# Patient Record
Sex: Female | Born: 1955 | Race: White | Hispanic: No | Marital: Married | Smoking: Never smoker
Health system: Southern US, Community
[De-identification: ages and names within clinical notes are randomized; demographics above are authoritative.]

## PROBLEM LIST (undated history)

## (undated) DIAGNOSIS — M1990 Unspecified osteoarthritis, unspecified site: Secondary | ICD-10-CM

## (undated) DIAGNOSIS — R42 Dizziness and giddiness: Secondary | ICD-10-CM

## (undated) DIAGNOSIS — R339 Retention of urine, unspecified: Secondary | ICD-10-CM

## (undated) DIAGNOSIS — E7800 Pure hypercholesterolemia, unspecified: Secondary | ICD-10-CM

## (undated) DIAGNOSIS — F0390 Unspecified dementia without behavioral disturbance: Secondary | ICD-10-CM

## (undated) DIAGNOSIS — G4730 Sleep apnea, unspecified: Secondary | ICD-10-CM

## (undated) HISTORY — DX: Sleep apnea, unspecified: G47.30

## (undated) HISTORY — PX: SALPINGECTOMY: SHX328

## (undated) HISTORY — DX: Dizziness and giddiness: R42

## (undated) HISTORY — DX: Unspecified osteoarthritis, unspecified site: M19.90

## (undated) HISTORY — DX: Pure hypercholesterolemia, unspecified: E78.00

---

## 1974-07-26 HISTORY — PX: TONSILECTOMY, ADENOIDECTOMY, BILATERAL MYRINGOTOMY AND TUBES: SHX2538

## 1975-07-27 HISTORY — PX: SPLENECTOMY, TOTAL: SHX788

## 2005-01-08 ENCOUNTER — Ambulatory Visit: Payer: Self-pay | Admitting: Internal Medicine

## 2005-01-23 ENCOUNTER — Ambulatory Visit: Payer: Self-pay | Admitting: Internal Medicine

## 2005-04-20 ENCOUNTER — Ambulatory Visit: Payer: Self-pay | Admitting: Internal Medicine

## 2005-07-26 HISTORY — PX: VAGINAL HYSTERECTOMY: SUR661

## 2006-04-03 ENCOUNTER — Emergency Department: Payer: Self-pay | Admitting: General Practice

## 2006-04-12 ENCOUNTER — Ambulatory Visit: Payer: Self-pay | Admitting: Physician Assistant

## 2006-07-22 ENCOUNTER — Emergency Department: Payer: Self-pay | Admitting: Emergency Medicine

## 2006-10-17 ENCOUNTER — Ambulatory Visit: Payer: Self-pay | Admitting: Internal Medicine

## 2006-10-29 ENCOUNTER — Encounter: Admission: RE | Admit: 2006-10-29 | Discharge: 2006-10-29 | Payer: Self-pay | Admitting: Orthopedic Surgery

## 2006-12-16 ENCOUNTER — Ambulatory Visit: Payer: Self-pay | Admitting: Otolaryngology

## 2007-10-19 ENCOUNTER — Ambulatory Visit: Payer: Self-pay | Admitting: Internal Medicine

## 2008-10-03 ENCOUNTER — Emergency Department: Payer: Self-pay | Admitting: Emergency Medicine

## 2008-10-21 ENCOUNTER — Ambulatory Visit: Payer: Self-pay | Admitting: Internal Medicine

## 2008-11-11 ENCOUNTER — Ambulatory Visit: Payer: Self-pay | Admitting: Internal Medicine

## 2008-12-09 ENCOUNTER — Ambulatory Visit: Payer: Self-pay | Admitting: Internal Medicine

## 2009-01-06 ENCOUNTER — Ambulatory Visit: Payer: Self-pay | Admitting: Internal Medicine

## 2009-01-09 ENCOUNTER — Ambulatory Visit: Payer: Self-pay | Admitting: Internal Medicine

## 2009-01-10 ENCOUNTER — Ambulatory Visit: Payer: Self-pay | Admitting: Unknown Physician Specialty

## 2009-03-28 ENCOUNTER — Emergency Department: Payer: Self-pay | Admitting: Emergency Medicine

## 2009-04-28 ENCOUNTER — Ambulatory Visit: Payer: Self-pay | Admitting: Internal Medicine

## 2009-05-13 ENCOUNTER — Ambulatory Visit: Payer: Self-pay | Admitting: Internal Medicine

## 2009-09-12 ENCOUNTER — Ambulatory Visit: Payer: Self-pay | Admitting: Internal Medicine

## 2009-09-16 ENCOUNTER — Ambulatory Visit: Payer: Self-pay | Admitting: Otolaryngology

## 2009-10-23 ENCOUNTER — Ambulatory Visit: Payer: Self-pay | Admitting: Internal Medicine

## 2009-12-05 ENCOUNTER — Ambulatory Visit: Payer: Self-pay | Admitting: Family Medicine

## 2010-04-07 ENCOUNTER — Ambulatory Visit: Payer: Self-pay | Admitting: Internal Medicine

## 2010-05-26 ENCOUNTER — Encounter: Payer: Self-pay | Admitting: Otolaryngology

## 2010-08-16 ENCOUNTER — Encounter: Payer: Self-pay | Admitting: Orthopedic Surgery

## 2010-08-21 IMAGING — CT CT HEAD WITHOUT CONTRAST
2 series · 16 of 30 positions shown, 20 images · non-contrast
Comparison: none

REASON FOR EXAM: generalized tingling and slow speech
COMMENTS:

[Series 2: without · axial · non-contrast · 0.40mm/px · z∈[+668,+793]mm · 13 of 60 slices shown, 17 images]
[im 5/60  brain]
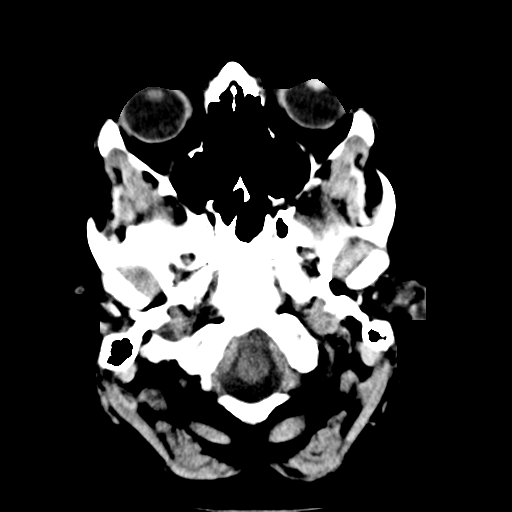
[im 5/60  bone]
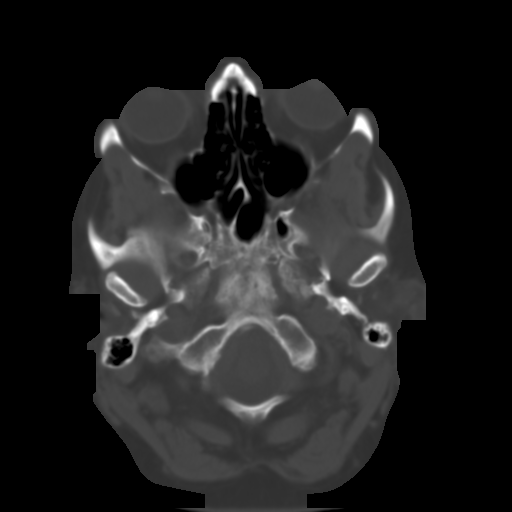
[im 9/60  brain]
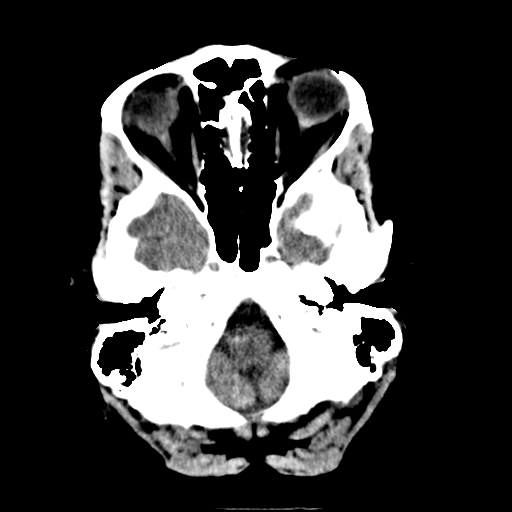
[im 13/60  brain]
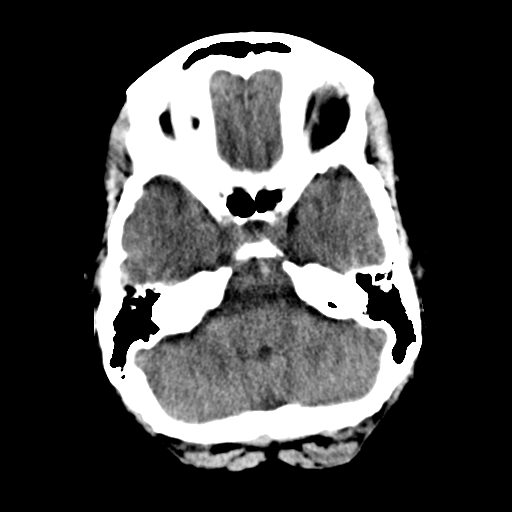
[im 17/60  brain]
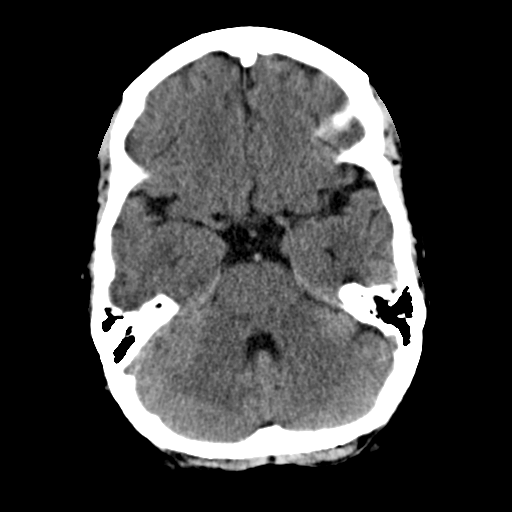
[im 22/60  brain]
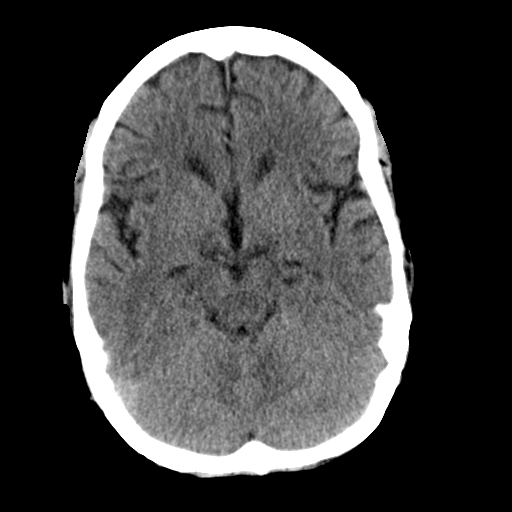
[im 22/60  bone]
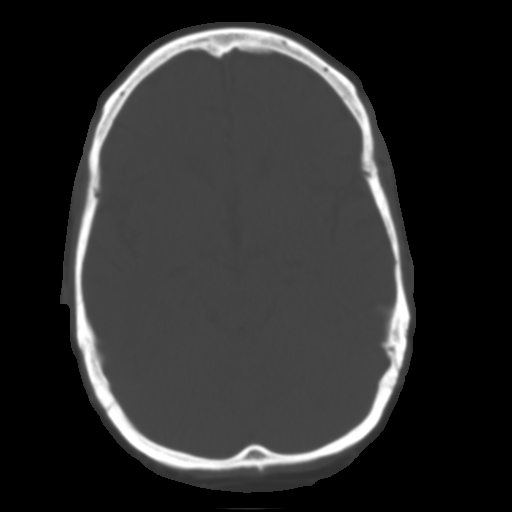
[im 26/60  brain]
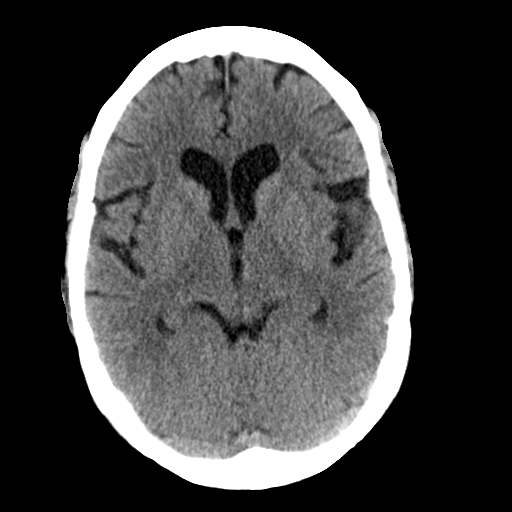
[im 30/60  brain]
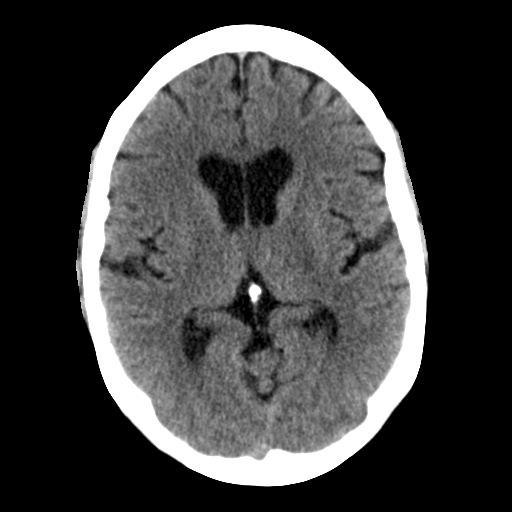
[im 34/60  brain]
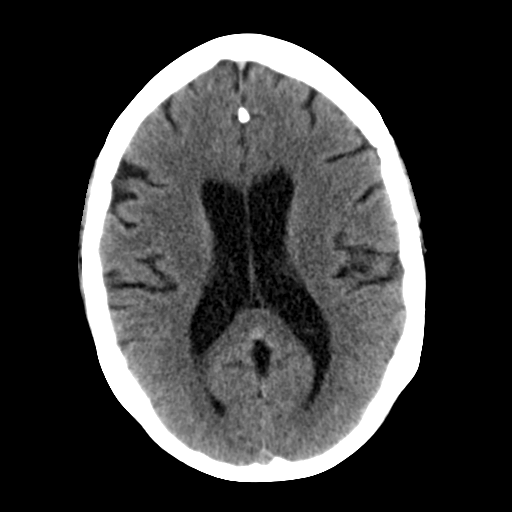
[im 38/60  brain]
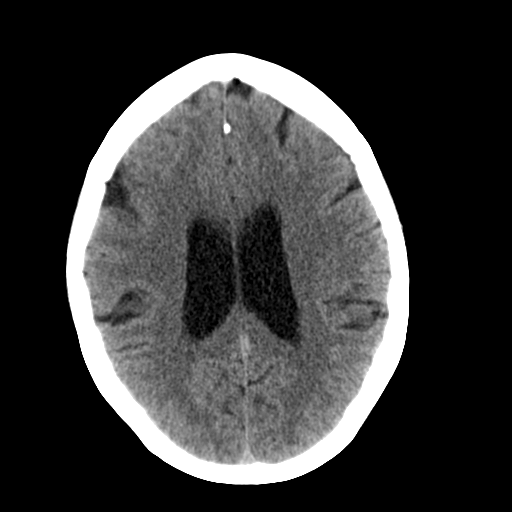
[im 38/60  bone]
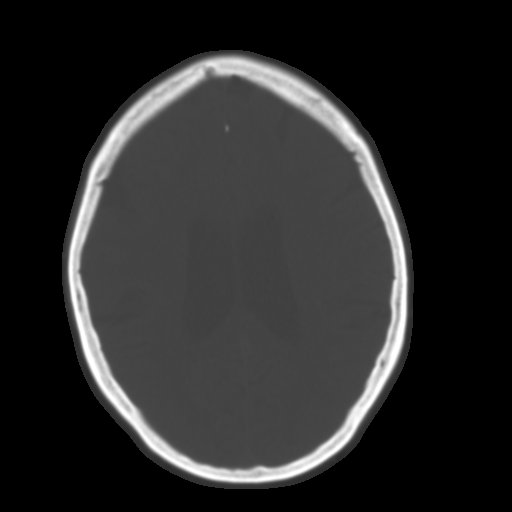
[im 43/60  brain]
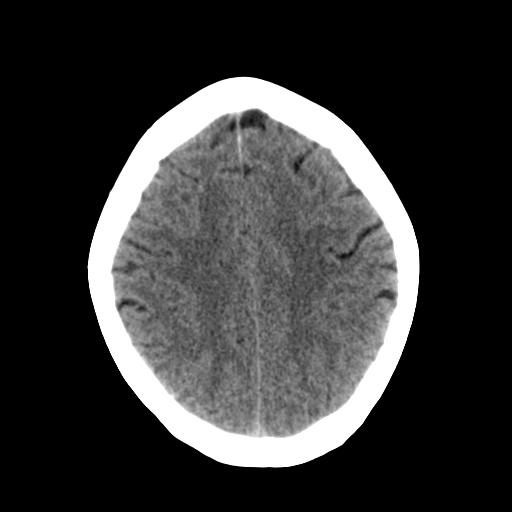
[im 47/60  brain]
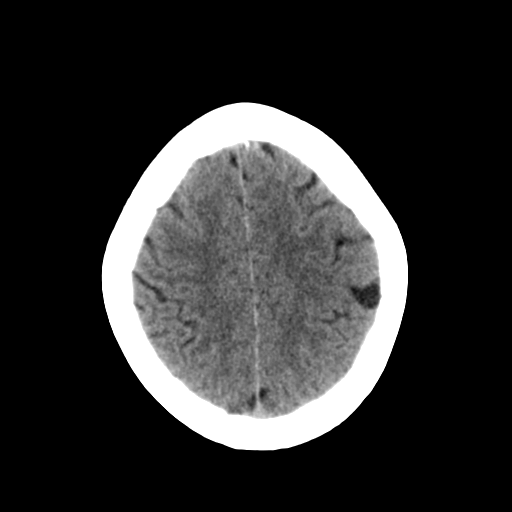
[im 51/60  brain]
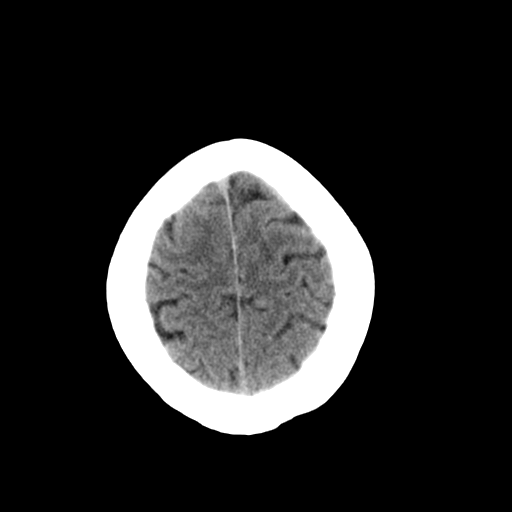
[im 55/60  brain]
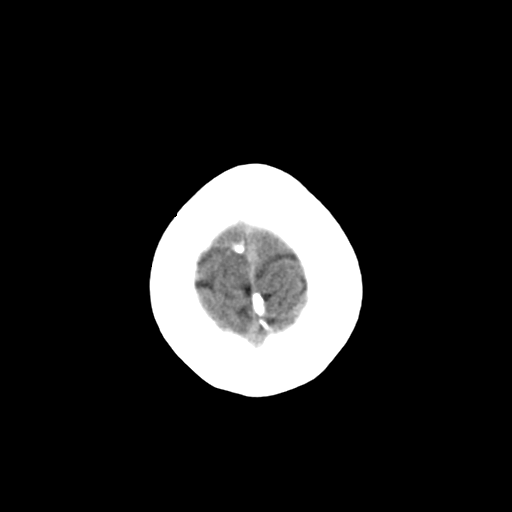
[im 55/60  bone]
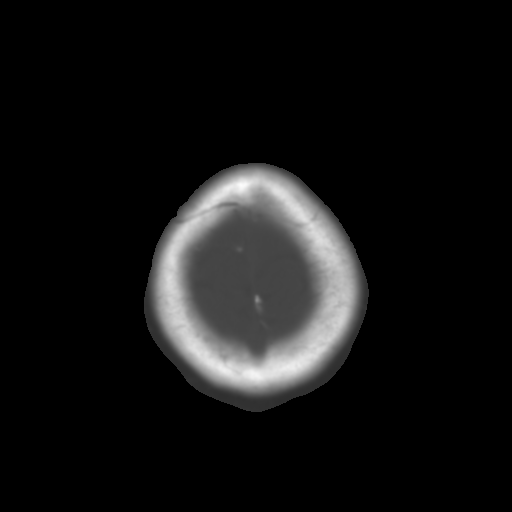

[Series 3: bone · axial · 0.40mm/px · z∈[+668,+708]mm · 3 of 60 slices shown]
[im 5/60  bone]
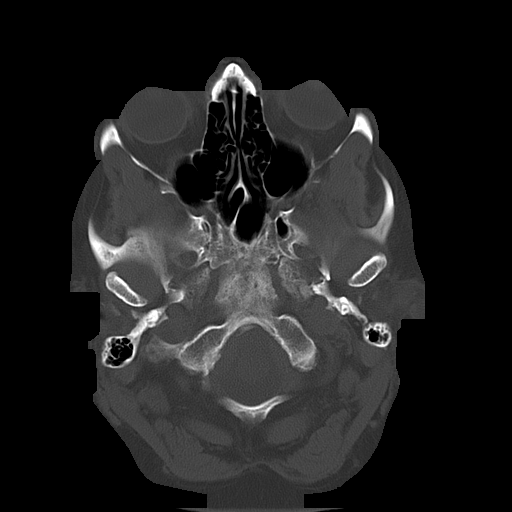
[im 13/60  bone]
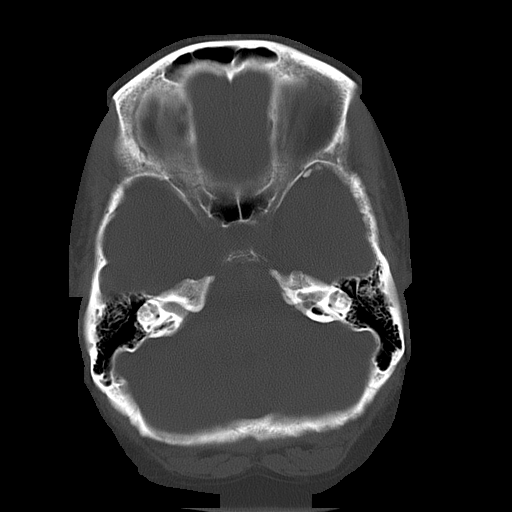
[im 22/60  bone]
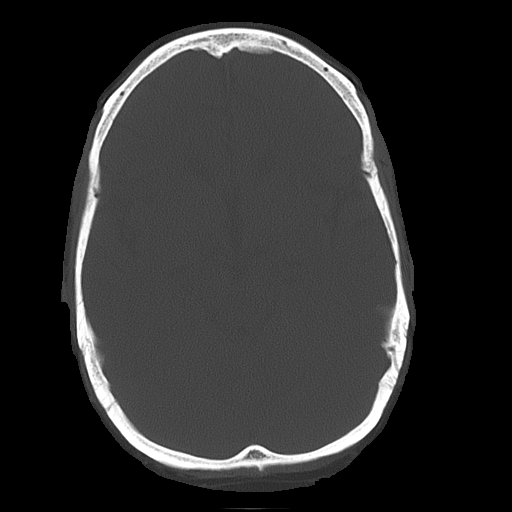

[16 of 30 positions shown; findings below may reference images not displayed]

PROCEDURE:     CT  - CT HEAD WITHOUT CONTRAST  - October 03, 2008  [DATE]

RESULT:     History: Abnormal speech

Comparison studies: No recent

Procedure and Findings: No intra-axial or extra-axial pathologic fluid or
blood collections identified. No mass lesions noted. No hydrocephalus. No
bony abnormalities noted.
IMPRESSION: No acute abnormality identified. This report was phoned to
patient's physician at the time of study.

## 2010-10-02 ENCOUNTER — Ambulatory Visit: Payer: Self-pay | Admitting: Internal Medicine

## 2010-10-14 ENCOUNTER — Emergency Department: Payer: Self-pay | Admitting: Emergency Medicine

## 2010-10-27 ENCOUNTER — Ambulatory Visit: Payer: Self-pay | Admitting: Internal Medicine

## 2010-11-09 ENCOUNTER — Other Ambulatory Visit: Payer: Self-pay | Admitting: Rheumatology

## 2010-11-19 ENCOUNTER — Other Ambulatory Visit: Payer: Self-pay | Admitting: Podiatry

## 2011-03-16 ENCOUNTER — Ambulatory Visit: Payer: Self-pay | Admitting: Internal Medicine

## 2011-04-30 ENCOUNTER — Emergency Department: Payer: Self-pay | Admitting: *Deleted

## 2011-05-13 ENCOUNTER — Ambulatory Visit: Payer: Self-pay | Admitting: Internal Medicine

## 2011-08-02 ENCOUNTER — Ambulatory Visit: Payer: Self-pay | Admitting: Rheumatology

## 2011-08-03 ENCOUNTER — Encounter: Payer: Self-pay | Admitting: Rheumatology

## 2011-08-27 ENCOUNTER — Encounter: Payer: Self-pay | Admitting: Rheumatology

## 2011-09-24 ENCOUNTER — Encounter: Payer: Self-pay | Admitting: Rheumatology

## 2011-11-02 ENCOUNTER — Ambulatory Visit: Payer: Self-pay | Admitting: Internal Medicine

## 2011-12-27 ENCOUNTER — Emergency Department: Payer: Self-pay | Admitting: Emergency Medicine

## 2011-12-27 LAB — URINALYSIS, COMPLETE
Bilirubin,UR: NEGATIVE
Blood: NEGATIVE
Glucose,UR: NEGATIVE mg/dL (ref 0–75)
Ph: 9 (ref 4.5–8.0)
Protein: NEGATIVE
RBC,UR: 1 /HPF (ref 0–5)
Specific Gravity: 1.003 (ref 1.003–1.030)
Squamous Epithelial: 22

## 2011-12-27 LAB — COMPREHENSIVE METABOLIC PANEL
Albumin: 3.8 g/dL (ref 3.4–5.0)
Alkaline Phosphatase: 45 U/L — ABNORMAL LOW (ref 50–136)
Anion Gap: 13 (ref 7–16)
BUN: 7 mg/dL (ref 7–18)
Bilirubin,Total: 0.5 mg/dL (ref 0.2–1.0)
Co2: 24 mmol/L (ref 21–32)
EGFR (Non-African Amer.): >60
Osmolality: 283 (ref 275–301)
Potassium: 3.3 mmol/L — ABNORMAL LOW (ref 3.5–5.1)
SGOT(AST): 27 U/L (ref 15–37)
Sodium: 143 mmol/L (ref 136–145)
Total Protein: 7.4 g/dL (ref 6.4–8.2)

## 2011-12-27 LAB — CBC
HCT: 40.1 % (ref 35.0–47.0)
MCHC: 33.6 g/dL (ref 32.0–36.0)
Platelet: 409 10*3/uL (ref 150–440)
RBC: 4.44 10*6/uL (ref 3.80–5.20)
RDW: 13.8 % (ref 11.5–14.5)
WBC: 6.9 10*3/uL (ref 3.6–11.0)

## 2011-12-27 LAB — CK TOTAL AND CKMB (NOT AT ARMC): CK-MB: 3.5 ng/mL (ref 0.5–3.6)

## 2011-12-27 LAB — TROPONIN I: Troponin-I: <0.02 ng/mL

## 2011-12-30 ENCOUNTER — Ambulatory Visit: Payer: Self-pay | Admitting: Physician Assistant

## 2012-03-05 ENCOUNTER — Ambulatory Visit: Payer: Self-pay | Admitting: Emergency Medicine

## 2012-03-05 LAB — COMPREHENSIVE METABOLIC PANEL
Albumin: 3.9 g/dL (ref 3.4–5.0)
Alkaline Phosphatase: 73 U/L (ref 50–136)
Anion Gap: 7 (ref 7–16)
BUN: 10 mg/dL (ref 7–18)
Bilirubin,Total: 0.4 mg/dL (ref 0.2–1.0)
Calcium, Total: 9.5 mg/dL (ref 8.5–10.1)
Chloride: 102 mmol/L (ref 98–107)
Co2: 31 mmol/L (ref 21–32)
Creatinine: 0.68 mg/dL (ref 0.60–1.30)
EGFR (African American): >60
EGFR (Non-African Amer.): >60
Glucose: 94 mg/dL (ref 65–99)
Osmolality: 278 (ref 275–301)
Potassium: 4.2 mmol/L (ref 3.5–5.1)
SGOT(AST): 21 U/L (ref 15–37)
SGPT (ALT): 25 U/L (ref 12–78)
Sodium: 140 mmol/L (ref 136–145)
Total Protein: 7.9 g/dL (ref 6.4–8.2)

## 2012-03-05 LAB — CBC WITH DIFFERENTIAL/PLATELET
Basophil #: 0.1 10*3/uL (ref 0.0–0.1)
Basophil %: 0.7 %
Eosinophil #: 0.1 10*3/uL (ref 0.0–0.7)
Eosinophil %: 0.7 %
HCT: 42.1 % (ref 35.0–47.0)
HGB: 14.1 g/dL (ref 12.0–16.0)
Lymphocyte #: 2.8 10*3/uL (ref 1.0–3.6)
Lymphocyte %: 32.2 %
MCH: 31 pg (ref 26.0–34.0)
MCHC: 33.6 g/dL (ref 32.0–36.0)
MCV: 92 fL (ref 80–100)
Monocyte #: 0.6 x10 3/mm (ref 0.2–0.9)
Monocyte %: 6.9 %
Neutrophil #: 5.2 10*3/uL (ref 1.4–6.5)
Neutrophil %: 59.5 %
Platelet: 415 10*3/uL (ref 150–440)
RBC: 4.56 10*6/uL (ref 3.80–5.20)
RDW: 13.7 % (ref 11.5–14.5)
WBC: 8.7 10*3/uL (ref 3.6–11.0)

## 2012-03-05 LAB — URINALYSIS, COMPLETE
Bilirubin,UR: NEGATIVE
Blood: NEGATIVE
Glucose,UR: NEGATIVE mg/dL (ref 0–75)
Ketone: NEGATIVE
Nitrite: NEGATIVE
Ph: 6.5 (ref 4.5–8.0)

## 2012-03-07 LAB — URINE CULTURE

## 2012-04-26 ENCOUNTER — Encounter: Payer: Self-pay | Admitting: Internal Medicine

## 2012-04-26 ENCOUNTER — Ambulatory Visit (INDEPENDENT_AMBULATORY_CARE_PROVIDER_SITE_OTHER): Admitting: Internal Medicine

## 2012-04-26 VITALS — BP 126/80 | HR 77 | Temp 98.7°F | Ht 69.0 in | Wt 187.5 lb

## 2012-04-26 DIAGNOSIS — R42 Dizziness and giddiness: Secondary | ICD-10-CM | POA: Insufficient documentation

## 2012-04-26 DIAGNOSIS — M129 Arthropathy, unspecified: Secondary | ICD-10-CM

## 2012-04-26 DIAGNOSIS — M1990 Unspecified osteoarthritis, unspecified site: Secondary | ICD-10-CM

## 2012-04-26 MED ORDER — FLUOXETINE HCL 20 MG PO TABS: 10.0000 mg | ORAL_TABLET | Freq: Every day | ORAL | Status: DC

## 2012-04-26 NOTE — Progress Notes (Signed)
Subjective:    Patient ID: Melinda [name], female    DOB: April 12, 1956, 56 y.o.   MRN: [id number]  HPI 56 year old female with past history of arthritis and splenectomy who comes in today for a scheduled follow up.  She reports being under an increased amount of stress recently.  Her husband has mesothelioma/asbestosis and is in failing health.  She is having to do the work around the house and is the sole caregiver for him.  No significant depression.  No suicidal ideations.  She is feeling overwhelmed.  She had lots of questions regarding what services are available for help within her home.  She also reports having increased problems with "vertigo".  This is an ongoing issue for her. She has had an extensive w/up for the dizziness.  Is seeing Dr Elenore Rota (ENT) and Dr Sherryll Burger.  She is on chronic Meclizine and Valium.  No headache.  She has noticed some increased belching.  No acid reflux.  No change in her bowels.    Past Medical History  Diagnosis Date  . Arthritis   . Vertigo     Review of Systems Patient denies any headache.  Does have the dizziness as outlined above.  Has had MRI and extensive w/up previously.   No chest pain, tightness or palpitations.  No increased shortness of breath, cough or congestion.  No nausea or vomiting.  No abdominal pain or cramping.  No bowel change, such as diarrhea, constipation, BRBPR or melana.  No urine change.  She does report some increased belching, but no acid reflux.  She is eating and drinking well.  Increased stress as outlined above.       Objective:   Physical Exam Filed Vitals:   04/26/12 1116  BP: 126/80  Pulse: 77  Temp: 98.7 F (12.68 C)   56 year old female in no acute distress.   HEENT:  Nares - clear.  OP- without lesions or erythema.  NECK:  Supple, nontender.  No audible carotid bruit.   HEART:  Appears to be regular. LUNGS:  Without crackles or wheezing audible.  Respirations even and unlabored.   RADIAL PULSE:  Equal  bilaterally.  ABDOMEN:  Soft, nontender.  No audible abdominal bruit.   EXTREMITIES:  No increased edema to be present.                      Assessment & Plan:  1.  INCREASED PSYCHOSOCIAL STRESSORS.  Discussed at length with her today.  Spent more than 25 minutes with the patient and more than 50% of the time was spent in discussing her current situation, stress and some recommendations to help her home situation.  We also discussed the possibility of referral to a counselor as well as starting on medication (ie Prozac, Zoloft, etc).  She wants to hold on counseling or medication at this point.  Will follow.  She was instructed that if symptoms worsened or if she changed her mind, she was to notify me.    Addendum:  After pt left, she called back and expressed a desire to start medication.  We discussed Prozac.  Discussed possible side effects and drug interactions.  Will start Prozac 10mg  q day.  Monitor.  Call if problems.    2.  GYN.  Continues on Premarin.  Discussed with her today.  She desires to remain on this medication.    3.  GI.  Some increased belching.  Monitor dietary intake to see if  certain foods aggravate.  Has tried Librarian, academic.  Obtain records from previous GI w/up.  Can try a trial of Ranitidine.    3.  HEALTH MAINTENANCE.  Will obtain her records for review.  Will schedule her physical and mammogram when due.  She has already received her flu shot.

## 2012-04-26 NOTE — Patient Instructions (Addendum)
It was good to see you today.  I am sorry to hear about the issues with your husband.  Let us know if you need something more.  I would contact the [facility] regarding possible options for assistance in your home.  I will obtain records from [place] to review.

## 2012-04-26 NOTE — Assessment & Plan Note (Signed)
She has been worked up extensively in the past.  She sees Dr Elenore Rota (ENT) and Dr Sherryll Burger (Neurology).  Dr Elenore Rota prescribes her Valium.  We discussed referral back to neurology - given this exacerbation of her symptoms.  She expressed that she may want to get another opinion in the future.  Wants to hold on any further testing or w/up at this time.  Will notify me if she changes her mind.

## 2012-04-26 NOTE — Assessment & Plan Note (Addendum)
Relatively stable. 

## 2012-04-27 ENCOUNTER — Telehealth: Payer: Self-pay | Admitting: Internal Medicine

## 2012-04-27 MED ORDER — ESTROGENS CONJUGATED 0.45 MG PO TABS: ORAL_TABLET | ORAL | Status: DC

## 2012-04-27 NOTE — Telephone Encounter (Signed)
Pt called she went to her drug store She got her prozac She was checking on her premarin Pharmacy :  [facility]  [phone]

## 2012-04-27 NOTE — Telephone Encounter (Signed)
i sent the rx to [facility] for her premarin

## 2012-04-28 ENCOUNTER — Telehealth: Payer: Self-pay | Admitting: Internal Medicine

## 2012-04-28 NOTE — Telephone Encounter (Signed)
Error

## 2012-04-29 ENCOUNTER — Ambulatory Visit: Payer: Self-pay | Admitting: Medical

## 2012-05-01 ENCOUNTER — Telehealth: Payer: Self-pay | Admitting: Internal Medicine

## 2012-05-01 DIAGNOSIS — F419 Anxiety disorder, unspecified: Secondary | ICD-10-CM

## 2012-05-01 NOTE — Telephone Encounter (Signed)
Pt is saying that since she has been on Prozac she has gotten extremely worse and has been to an Urgent care and ER over the weekend. She isn't sure what she should do.

## 2012-05-01 NOTE — Addendum Note (Signed)
Addended by: Charm Barges on: 05/01/2012 03:25 PM   Modules accepted: Orders

## 2012-05-01 NOTE — Telephone Encounter (Signed)
In what way is she worse?  rec stopping the Prozac.  If increased problems with stress rec referral to psych (or at least a counselor).

## 2012-05-01 NOTE — Telephone Encounter (Signed)
I don't know of anyone in [place], but can refer to Melinda [name] (at [facility] - psychologist who works closely with a psychiatrist).  Also Melinda [name] [facility]), or Melinda [name] (here in town).  These are the ones I am aware of.  I will refer her to either one - or if she knows someone in [place] or closer to her - I can refer.  Need ER records to review if possible.  If she has any acute problems, she needs to return to the ER.

## 2012-05-01 NOTE — Telephone Encounter (Signed)
Patient stated that she is having increased agitation, disoriented, and she has been very cold.  She went to Urgent Care in [facility] on Friday.  Patient was also seen in [facility] ED on Sunday, she would like to see a counselor or  Psychiatrist but would like someone in [place] if possible.

## 2012-05-01 NOTE — Telephone Encounter (Signed)
Patient advised as instructed via telephone, she is willing to see Dr. Laymond Purser at [facility].  She will wait to hear from [facility] regarding appt.

## 2012-05-03 ENCOUNTER — Telehealth: Payer: Self-pay | Admitting: Internal Medicine

## 2012-05-03 DIAGNOSIS — F419 Anxiety disorder, unspecified: Secondary | ICD-10-CM

## 2012-05-03 NOTE — Telephone Encounter (Signed)
Pt wanted to know if she could have her pneumococcal Shot  Pt wanted check on her referral to counciler

## 2012-05-03 NOTE — Telephone Encounter (Signed)
I put in the order for her psych referral.  Erie Noe will let her know when appt scheduled.  Regarding her pneumovax - need to know when she received her last.  See if she can find out.  Thanks.

## 2012-05-03 NOTE — Telephone Encounter (Signed)
Patient advised as instructed via telephone, she stated that she signed a release of info form and sent it in.  Advised patient that we will need to get her records to see when her last pneumonia vaccine was.

## 2012-05-03 NOTE — Telephone Encounter (Signed)
Order placed for referral to psychology

## 2012-05-12 ENCOUNTER — Telehealth: Payer: Self-pay | Admitting: Internal Medicine

## 2012-05-12 NOTE — Telephone Encounter (Signed)
I can see her at 8:00 wed 05/17/12.  If any acute sx, she needs eval over the weekend.  Thanks.

## 2012-05-12 NOTE — Telephone Encounter (Signed)
Pt informed of MD's advisement. Appointment schedule 05/17/2012 at 8:00am, pt informed of new appointment.

## 2012-05-12 NOTE — Telephone Encounter (Signed)
Patient has an appointment scheduled for 05/19/12 @ 2:00 pm - Is there any way she can get an appointment earlier in the week ?  Caller: Melinda [name]/Patient; PCP: [facility]; CB#: [phone]; Hysterectomy. 05/12/12 - Patient needs a dosage for potassium. Patient states that she had total weakness and her arms & legs went numb for about 5 minutes. She is still jittery and "not herself" today, but has had no numbness today. Muscle soreness/tiredness today - able to walk short distances. Afebrile. All Emergent Signs/Symptoms of Weakness or Paralysis Protocol ruled out except "all other situations" (disposition-see in 72 hours). Advised not to take any potassium supplements unless it is recommended by her physician   Advised patient to be seen and evaluated. Patient stated that she has no way to get anywhere at this time and does have an appointment scheduled for 05/19/12 that was made after she called the office yesterday afternoon about her symptoms. Advised to call 911 if she has another episode. Note sent to office to see if an appointment could be scheduled any sooner than 05/19/12.

## 2012-05-14 ENCOUNTER — Emergency Department: Payer: Self-pay | Admitting: Emergency Medicine

## 2012-05-14 LAB — URINALYSIS, COMPLETE
Bacteria: NONE SEEN
Bilirubin,UR: NEGATIVE
Glucose,UR: NEGATIVE mg/dL (ref 0–75)
Leukocyte Esterase: NEGATIVE
RBC,UR: NONE SEEN /HPF (ref 0–5)
Squamous Epithelial: 1
WBC UR: 1 /HPF (ref 0–5)

## 2012-05-14 LAB — CBC
HGB: 14.3 g/dL (ref 12.0–16.0)
MCH: 30.5 pg (ref 26.0–34.0)
MCHC: 33.7 g/dL (ref 32.0–36.0)
RBC: 4.69 10*6/uL (ref 3.80–5.20)

## 2012-05-14 LAB — COMPREHENSIVE METABOLIC PANEL
Albumin: 4.1 g/dL (ref 3.4–5.0)
Anion Gap: 9 (ref 7–16)
BUN: 10 mg/dL (ref 7–18)
Bilirubin,Total: 0.5 mg/dL (ref 0.2–1.0)
Calcium, Total: 9.5 mg/dL (ref 8.5–10.1)
Chloride: 104 mmol/L (ref 98–107)
Creatinine: 0.66 mg/dL (ref 0.60–1.30)
EGFR (African American): >60
EGFR (Non-African Amer.): >60
Potassium: 3.3 mmol/L — ABNORMAL LOW (ref 3.5–5.1)
SGOT(AST): 26 U/L (ref 15–37)

## 2012-05-15 ENCOUNTER — Telehealth: Payer: Self-pay | Admitting: Internal Medicine

## 2012-05-15 NOTE — Telephone Encounter (Signed)
Pt called to let you know she was in er @[facility]  10/20.  i faxed to get notes.  Pt stated they found nothing wrong told her it was stress related and weakness Pt wanted to cancel appointment appointment for 10/23.  Advise pt to wait until Tuesday before lunch (12) to cancel.

## 2012-05-16 NOTE — Telephone Encounter (Signed)
Left message at home for pt to call.  Wanted to see if pt was still coming in on 10/23

## 2012-05-16 NOTE — Telephone Encounter (Signed)
Is she going to cancel appt.  I am ok with canceling if she does not feels she needs to come in.  Thanks.

## 2012-05-17 ENCOUNTER — Ambulatory Visit (INDEPENDENT_AMBULATORY_CARE_PROVIDER_SITE_OTHER): Admitting: Internal Medicine

## 2012-05-17 ENCOUNTER — Encounter: Payer: Self-pay | Admitting: Internal Medicine

## 2012-05-17 VITALS — BP 120/68 | HR 75 | Temp 98.1°F | Ht 69.0 in | Wt 182.0 lb

## 2012-05-17 DIAGNOSIS — M79609 Pain in unspecified limb: Secondary | ICD-10-CM

## 2012-05-17 DIAGNOSIS — R109 Unspecified abdominal pain: Secondary | ICD-10-CM

## 2012-05-17 DIAGNOSIS — R531 Weakness: Secondary | ICD-10-CM

## 2012-05-17 DIAGNOSIS — R5383 Other fatigue: Secondary | ICD-10-CM

## 2012-05-17 DIAGNOSIS — M79606 Pain in leg, unspecified: Secondary | ICD-10-CM

## 2012-05-17 DIAGNOSIS — E878 Other disorders of electrolyte and fluid balance, not elsewhere classified: Secondary | ICD-10-CM

## 2012-05-17 DIAGNOSIS — G4730 Sleep apnea, unspecified: Secondary | ICD-10-CM

## 2012-05-17 DIAGNOSIS — R42 Dizziness and giddiness: Secondary | ICD-10-CM

## 2012-05-17 LAB — POTASSIUM: Potassium: 4.2 meq/L (ref 3.5–5.1)

## 2012-05-17 LAB — CK: Total CK: 154 U/L (ref 7–177)

## 2012-05-17 NOTE — Telephone Encounter (Signed)
Pt came in for her appointment

## 2012-05-17 NOTE — Patient Instructions (Addendum)
It was nice seeing you today.  I am sorry you have not been feeling well.  I am going to refer you to gastroenterology and schedule a pelvic ultrasound.  I am also going to check labs today.  We will notify you of the results once they become available.

## 2012-05-18 ENCOUNTER — Encounter: Payer: Self-pay | Admitting: Internal Medicine

## 2012-05-18 ENCOUNTER — Telehealth: Payer: Self-pay | Admitting: Internal Medicine

## 2012-05-18 MED ORDER — ESTROGENS CONJUGATED 0.45 MG PO TABS: ORAL_TABLET | ORAL | Status: DC

## 2012-05-18 NOTE — Telephone Encounter (Signed)
Pt is calling and wondering about her MyChart. She is saying it says overdue beside everything ??? I don't know much about MyChart so she wanted to speak to a nurse to see if everything was put in right in her chart.

## 2012-05-18 NOTE — Telephone Encounter (Signed)
Pt left message  Checking  on her premairn rx

## 2012-05-18 NOTE — Telephone Encounter (Signed)
Spoke with patient Premarin script resent to [facility] in [place] and set it as her preferred pharmacy.

## 2012-05-18 NOTE — Telephone Encounter (Signed)
Spoke with patient discussed why her record had incomplete hx documentation (awaiting records from [place]) will enter the information that she sent via MyChart email.

## 2012-05-19 ENCOUNTER — Ambulatory Visit: Admitting: Internal Medicine

## 2012-05-22 ENCOUNTER — Encounter: Payer: Self-pay | Admitting: *Deleted

## 2012-05-22 ENCOUNTER — Ambulatory Visit: Payer: Self-pay | Admitting: Internal Medicine

## 2012-05-22 NOTE — Progress Notes (Signed)
Results letter mailed to patient.

## 2012-05-24 ENCOUNTER — Encounter: Payer: Self-pay | Admitting: Internal Medicine

## 2012-05-24 ENCOUNTER — Other Ambulatory Visit: Payer: Self-pay | Admitting: Internal Medicine

## 2012-05-24 DIAGNOSIS — G4730 Sleep apnea, unspecified: Secondary | ICD-10-CM | POA: Insufficient documentation

## 2012-05-24 DIAGNOSIS — R1900 Intra-abdominal and pelvic swelling, mass and lump, unspecified site: Secondary | ICD-10-CM

## 2012-05-24 NOTE — Assessment & Plan Note (Signed)
stalbe

## 2012-05-24 NOTE — Progress Notes (Signed)
Subjective:    Patient ID: Melinda [name], female    DOB: 04-26-1956, 56 y.o.   MRN: [id number]  HPI 56 year old female with past history of hypercholesterolemia and vertigo (s/p extensive w/up - followed by neurology) who comes in today for a follow up appt.  She was in the ER 05/14/12 for "weakness".  She also states at that time - her legs were aching.  Labs, EKG (per pt) unrevealing.  She did have a slightly decreased potassium - 3.3.  She saw Dr Gwen Pounds 05/16/12 for a follow up.  Had a stress test and states she was told "everything was ok".  Was placed on Prilosec.  She felt the Prilosec contributed to increased gas.  She also was questioning whether or not her CPAP could be causing some increased gas.  No vomiting.  Increased belching.  Some abdominal discomfort - fullness.  No vomiting.  No acute bowel change.    Past Medical History  Diagnosis Date  . Arthritis   . Vertigo   . Hypercholesterolemia   . Sleep apnea     Review of Systems Patient denies any headache.  Dizziness/light headedness - unchanged.  Has been worked up extensively.  Saw ENT and Neurology.   No palpatations.  Denies any significant chest pain.  No increased shortness of breath, cough or congestion.  No nausea or vomiting.  Does report the increased abdominal fullness, gas and belching.  No bowel change, such as diarrhea, constipation, BRBPR or melana.  No urine change.  No vaginal symptoms.      Objective:   Physical Exam Filed Vitals:   05/17/12 0809  BP: 120/68  Pulse: 75  Temp: 98.1 F (8.49 C)   56 year old female in no acute distress.   HEENT:  Nares - clear.  OP- without lesions or erythema.  NECK:  Supple, nontender.  No audible carotid bruit.   HEART:  Appears to be regular. LUNGS:  Without crackles or wheezing audible.  Respirations even and unlabored.   RADIAL PULSE:  Equal bilaterally.  ABDOMEN:  Soft.  Bowel sounds present and normal.  Minimal increased discomfort in the lower  abdomen/pelvic region.  No audible abdominal bruit.   EXTREMITIES:  No increased edema to be present.                     Assessment & Plan:  INCREASED ABDOMINAL BLOATING AND GAS.  Has tried Prilosec and other PPIs without improvement.  She feels the PPIs may have made symptoms worse.  Can try Zantac to see if she gets any improvement.  Has tried Librarian, academic - with no change in symptoms.  Given persistent symptoms, will refer back to GI for evaluation and further w/up.  Will also check a pelvic ultrasound to confirm ovaries normal - given the persistent fullness/bloating and increased gas.  She also has some minimal lower abdomen/pelvic discomfort on exam.  Further w/up pending results of above.  Pt comfortable with this plan.   HYPOKALEMIA.  Potassium slightly decreased in the ER.  Recheck today.    CARDIOVASCULAR.  Just saw Dr Gwen Pounds.  Per his note, the stress test revealed normal LV function at peak stress.  There were changes in the inferior intraseptal area.  These changes were not associated with symptoms or EKG changes.  He did not feel this was clinically significant and did not feel further cardiac w/up warranted.    INCREASED PSYCHOSOCIAL STRESSORS.  Keep scheduled appt with Evalina Field.  Appears to be stable.  Still dealing with the increased stress of her husbands medical issues.  Again discussed with her today regarding getting some assistance in the home.

## 2012-05-24 NOTE — Assessment & Plan Note (Signed)
Continues on CPAP 

## 2012-05-25 ENCOUNTER — Encounter: Payer: Self-pay | Admitting: Internal Medicine

## 2012-05-26 ENCOUNTER — Telehealth: Payer: Self-pay | Admitting: Internal Medicine

## 2012-05-26 NOTE — Telephone Encounter (Signed)
Pt was calling concerning A1c. She wanted to speak with a nurse about labwork.

## 2012-05-28 ENCOUNTER — Encounter: Payer: Self-pay | Admitting: Internal Medicine

## 2012-06-06 ENCOUNTER — Ambulatory Visit: Payer: Self-pay | Admitting: Obstetrics and Gynecology

## 2012-06-07 ENCOUNTER — Telehealth: Payer: Self-pay | Admitting: Internal Medicine

## 2012-06-07 NOTE — Telephone Encounter (Signed)
Since I am unable to call pt back right now, please call her and find out what is going on.

## 2012-06-07 NOTE — Telephone Encounter (Signed)
Dr Lindell Spar is going to send her to [facility] for follow-up at 3:00 12/10. Patient is not sure if this is the right appt. Info. She wants to know if you would know and call her back to let her know.Melinda [name]

## 2012-06-07 NOTE — Telephone Encounter (Signed)
Called pt and discussed.  Pt was concerned that she had an EGD scheduled on 07/04/12 - same day as appt with Dr Hyacinth Meeker.  She will call Dawn's offce (GI) and let them know needs to reschedule.  Please call [facility] and request copy of her CT scan (done yesterday).  Pt would like for me to have copy.

## 2012-06-07 NOTE — Telephone Encounter (Signed)
Pt is extremely scared about news she received from Dr. Lindell Spar about her recent CT scan. She wanted to speak with Dr. Lorin Picket.

## 2012-06-08 ENCOUNTER — Telehealth: Payer: Self-pay | Admitting: Internal Medicine

## 2012-06-08 NOTE — Telephone Encounter (Signed)
Pt left message to let you know she has consultation  With gi on 07/04/12 not an upper gi  She also confirmed her appointment with bridgett miller 07/04/12 @ 3pm

## 2012-06-08 NOTE — Telephone Encounter (Signed)
[facility], they are going to fax over ct scan.

## 2012-06-08 NOTE — Telephone Encounter (Signed)
Noted.  Hold message until we receive.

## 2012-06-09 ENCOUNTER — Telehealth: Payer: Self-pay | Admitting: Internal Medicine

## 2012-06-09 NOTE — Telephone Encounter (Signed)
Pt was notified previously about her pelvic ultrasound results.  Was referred to GYN (Dr Jean Rosenthal).  He evaluated.  CT ordered.  CT revealed cystic mass.  Referred to Dr Purvis Sheffield.  See note.

## 2012-06-09 NOTE — Telephone Encounter (Signed)
Discussed CT findings with pt.  Dr Jean Rosenthal ordered.  She has already been referred to Dr Purvis Sheffield for evaluation and further testing.  Discussed the pulmonary nodule.  Will wait on chest CT until after Dr Rondel Baton appt.  (to see what she orders).

## 2012-06-26 ENCOUNTER — Encounter: Payer: Self-pay | Admitting: Internal Medicine

## 2012-07-04 ENCOUNTER — Ambulatory Visit: Payer: Self-pay | Admitting: Gynecologic Oncology

## 2012-07-12 ENCOUNTER — Encounter: Payer: Self-pay | Admitting: Internal Medicine

## 2012-07-14 ENCOUNTER — Telehealth: Payer: Self-pay | Admitting: Internal Medicine

## 2012-07-14 NOTE — Telephone Encounter (Signed)
Pt is calling and wanted to let you know that Dr. Jean Rosenthal with [facility] set her up for surgery on 08/08/12. He is going to do the surgery but Dr. Hyacinth Meeker is going to assist with him. She just wanted to let you know.

## 2012-07-26 ENCOUNTER — Ambulatory Visit: Payer: Self-pay | Admitting: Gynecologic Oncology

## 2012-07-27 ENCOUNTER — Telehealth: Payer: Self-pay | Admitting: Internal Medicine

## 2012-07-27 NOTE — Telephone Encounter (Signed)
I spoke to pt and explained to her that the reason why her health maintenance was not updated was because she was worked in for acute problems and we did not address health maintenance issues.  I updated what I could.  I explained to her we would try to get this updated when she came for her physical.  Please call [facility] (michelle) and see if she can send her last few notes and the last pneumovax date and the last colonoscopy.  Also please notify Asher Muir or Zella Ball that she needs to cancer her physical on 08/02/12 - secondary to her having surgery. Thanks.

## 2012-07-27 NOTE — Telephone Encounter (Signed)
Pt called stated her my chart is telling the below items are past due  Flu shot   Pt received 04/19/12 at [facility] in [place]  Pap smear  Pt stated she does not get these any more  tentus  Pt wasn't for when she received this  Mammogram  Pt had done 10/06/11 @ [facility] [place]  colonscopy pt stated has been 2 or 3 years ago.  Pt stated was good for 10 years.  pniumia shot  Pt stated she didn't have a spleen so she needs to get this shot  Is this ok to make appointment for this??  Pt stated she has appointment Dr Almon Hercules obgyn  pt is having preop on  Jan 7 @ 8:10  surgery is jan 14 to remove a 4 in object in pelvis  Does pt need to come in for her cpx 08/02/12?   Pt would like you to update this in her chart

## 2012-07-28 NOTE — Telephone Encounter (Signed)
Called Marcelino Duster at [facility] to have patient's info sent over. Left detailed message on her voice mail.

## 2012-07-28 NOTE — Telephone Encounter (Signed)
Melinda [name] would you please cancel patients appointment for physical on 08/02/12- secondary to her having surgery. Thank You

## 2012-08-01 ENCOUNTER — Ambulatory Visit: Payer: Self-pay | Admitting: Obstetrics and Gynecology

## 2012-08-01 LAB — COMPREHENSIVE METABOLIC PANEL
Alkaline Phosphatase: 58 U/L (ref 50–136)
BUN: 9 mg/dL (ref 7–18)
Bilirubin,Total: 0.4 mg/dL (ref 0.2–1.0)
Calcium, Total: 9.2 mg/dL (ref 8.5–10.1)
Chloride: 102 mmol/L (ref 98–107)
EGFR (African American): >60
Glucose: 105 mg/dL — ABNORMAL HIGH (ref 65–99)
Osmolality: 275 (ref 275–301)
Potassium: 4.1 mmol/L (ref 3.5–5.1)
SGOT(AST): 20 U/L (ref 15–37)
Sodium: 138 mmol/L (ref 136–145)

## 2012-08-01 LAB — CBC
HCT: 41.6 % (ref 35.0–47.0)
MCH: 30.3 pg (ref 26.0–34.0)
MCHC: 33.4 g/dL (ref 32.0–36.0)
MCV: 91 fL (ref 80–100)
RDW: 13.2 % (ref 11.5–14.5)
WBC: 9.8 10*3/uL (ref 3.6–11.0)

## 2012-08-02 ENCOUNTER — Encounter: Admitting: Internal Medicine

## 2012-08-08 ENCOUNTER — Ambulatory Visit: Payer: Self-pay | Admitting: Obstetrics and Gynecology

## 2012-08-14 ENCOUNTER — Encounter: Payer: Self-pay | Admitting: Internal Medicine

## 2012-08-24 ENCOUNTER — Ambulatory Visit: Payer: Self-pay | Admitting: Emergency Medicine

## 2012-08-28 ENCOUNTER — Emergency Department: Payer: Self-pay | Admitting: Emergency Medicine

## 2012-08-28 LAB — COMPREHENSIVE METABOLIC PANEL WITH GFR
Albumin: 3.9 g/dL
Alkaline Phosphatase: 57 U/L
Anion Gap: 9
BUN: 7 mg/dL
Bilirubin,Total: 0.5 mg/dL
Calcium, Total: 9.4 mg/dL
Chloride: 105 mmol/L
Co2: 25 mmol/L
Creatinine: 0.5 mg/dL — ABNORMAL LOW
EGFR (African American): >60
EGFR (Non-African Amer.): >60
Glucose: 109 mg/dL — ABNORMAL HIGH
Osmolality: 276
Potassium: 3.2 mmol/L — ABNORMAL LOW
SGOT(AST): 22 U/L
SGPT (ALT): 25 U/L
Sodium: 139 mmol/L
Total Protein: 8 g/dL

## 2012-08-28 LAB — URINALYSIS, COMPLETE
Bacteria: NONE SEEN
Bilirubin,UR: NEGATIVE
Leukocyte Esterase: NEGATIVE
Nitrite: NEGATIVE
Ph: 9 (ref 4.5–8.0)
Protein: NEGATIVE
RBC,UR: 32 /HPF (ref 0–5)
Squamous Epithelial: <1
WBC UR: <1 /HPF (ref 0–5)

## 2012-08-28 LAB — CBC
HCT: 42.1 % (ref 35.0–47.0)
MCHC: 33.9 g/dL (ref 32.0–36.0)
MCV: 90 fL (ref 80–100)
Platelet: 459 10*3/uL — ABNORMAL HIGH (ref 150–440)
RDW: 13.3 % (ref 11.5–14.5)

## 2012-08-30 ENCOUNTER — Telehealth: Payer: Self-pay | Admitting: Internal Medicine

## 2012-08-30 NOTE — Telephone Encounter (Signed)
Pt is needing to speak with a nurse to let you all know what is going on. She is up set because the Hospital sent her home with a cath for her to put in by herself without showing her how to do it.

## 2012-08-30 NOTE — Telephone Encounter (Signed)
Please call pt and find out what is going on.  If was recently hospitalized - if questions regarding cath - may need to call MD who had her during her hospitalization (for her procedure).

## 2012-08-30 NOTE — Telephone Encounter (Signed)
Dr. Jean Rosenthal gave fluconazole 100 mg 2/daily for yeast fungal infection. [facility] put in cathter in on 08/28/2012, was removed at [facility]. Patient had concerns and questions about medication. Advised patient to seek advice from prescribing physician per Dr. Lorin Picket. Patient does not expect a call back.

## 2012-09-01 ENCOUNTER — Emergency Department: Payer: Self-pay | Admitting: Emergency Medicine

## 2012-09-01 LAB — URINALYSIS, COMPLETE
Bilirubin,UR: NEGATIVE
Blood: NEGATIVE
Glucose,UR: NEGATIVE mg/dL (ref 0–75)
Leukocyte Esterase: NEGATIVE
Nitrite: NEGATIVE
Ph: 8 (ref 4.5–8.0)
Protein: NEGATIVE
RBC,UR: <1 /HPF (ref 0–5)
Specific Gravity: 1.005 (ref 1.003–1.030)
Squamous Epithelial: <1

## 2012-09-11 ENCOUNTER — Telehealth: Payer: Self-pay | Admitting: Internal Medicine

## 2012-09-11 NOTE — Telephone Encounter (Signed)
Patient stated that her CPAP machine is broken. Patient is waiting on a call from [facility]. [name] from [facility] is checking with Melinda [name]'s insurance company to see if they will pay for new machine. Patient may need a new prescription. Patient is going to call us back and let us know.

## 2012-09-11 NOTE — Telephone Encounter (Signed)
Pt is calling about her [facility] and some others things she needs to discuss. Pt is having problems staying in and out of reality, she really needs to speak with someone.

## 2012-09-11 NOTE — Telephone Encounter (Signed)
Need info.  She just had surgery.  Need specifics about her problems or concerns.

## 2012-09-18 ENCOUNTER — Telehealth: Payer: Self-pay | Admitting: Internal Medicine

## 2012-09-18 NOTE — Telephone Encounter (Signed)
Patient notified as instructed by telephone. 

## 2012-09-18 NOTE — Telephone Encounter (Signed)
I agree with calling Dr Jean Rosenthal - not only about the question of the medication but also with the urinary issues.  Apparently he has also recently treated her for uti.

## 2012-09-18 NOTE — Telephone Encounter (Signed)
Patient called and left a voice mail stating she would like someone to call her back, she has been having some issues with her medication that was given to her by her gynecologist.

## 2012-09-18 NOTE — Telephone Encounter (Signed)
Patient wanted you to know Dr.Steven Jean Rosenthal called her asked me to take 5 more days amoxicillin, she does not understand why she had to take it. Patient called back to let you know that she has original bottle. Patient also wants you to know that she is urinating constantly. Patient would like a call back about this issue. Patient was advised to call office who prescribed medication.

## 2012-09-19 ENCOUNTER — Telehealth: Payer: Self-pay | Admitting: Internal Medicine

## 2012-09-19 NOTE — Telephone Encounter (Signed)
Pt wanted to let Dr. Lorin Picket know she has gotten back with Dr. Jean Rosenthal (Gyn) and has smoothed things over.  States her urine levels are much better now.  Pt states she will call back to make a f/u appt with Dr. Lorin Picket when she better knows her schedule.

## 2012-09-24 ENCOUNTER — Emergency Department: Payer: Self-pay | Admitting: Emergency Medicine

## 2012-09-24 LAB — URINALYSIS, COMPLETE
Bacteria: NONE SEEN
Blood: NEGATIVE
Glucose,UR: NEGATIVE mg/dL (ref 0–75)
Ketone: NEGATIVE
Leukocyte Esterase: NEGATIVE
RBC,UR: <1 /HPF (ref 0–5)
Squamous Epithelial: <1

## 2012-09-24 LAB — CBC WITH DIFFERENTIAL/PLATELET
Basophil #: 0 10*3/uL (ref 0.0–0.1)
Eosinophil %: 0.5 %
HCT: 40.8 % (ref 35.0–47.0)
Lymphocyte #: 1.8 10*3/uL (ref 1.0–3.6)
Lymphocyte %: 20.6 %
MCH: 30.6 pg (ref 26.0–34.0)
MCHC: 33.4 g/dL (ref 32.0–36.0)
MCV: 92 fL (ref 80–100)
Monocyte %: 6.6 %
RBC: 4.46 10*6/uL (ref 3.80–5.20)
RDW: 13.8 % (ref 11.5–14.5)
WBC: 8.9 10*3/uL (ref 3.6–11.0)

## 2012-09-24 LAB — COMPREHENSIVE METABOLIC PANEL
Albumin: 3.6 g/dL (ref 3.4–5.0)
Anion Gap: 4 — ABNORMAL LOW (ref 7–16)
Bilirubin,Total: 0.3 mg/dL (ref 0.2–1.0)
Co2: 31 mmol/L (ref 21–32)
Glucose: 87 mg/dL (ref 65–99)
Osmolality: 282 (ref 275–301)
SGOT(AST): 27 U/L (ref 15–37)
SGPT (ALT): 35 U/L (ref 12–78)
Sodium: 142 mmol/L (ref 136–145)
Total Protein: 7.8 g/dL (ref 6.4–8.2)

## 2012-09-24 LAB — MAGNESIUM: Magnesium: 1.8 mg/dL

## 2012-10-05 ENCOUNTER — Telehealth: Payer: Self-pay | Admitting: *Deleted

## 2012-10-05 NOTE — Telephone Encounter (Signed)
Patient called because order form for sleep med had not been faxed over. Also spoke to [place] at [facility] about their not receiving the fax. I faxed forms to them.

## 2012-10-07 ENCOUNTER — Ambulatory Visit: Payer: Self-pay | Admitting: Emergency Medicine

## 2012-10-13 ENCOUNTER — Ambulatory Visit (INDEPENDENT_AMBULATORY_CARE_PROVIDER_SITE_OTHER): Admitting: Internal Medicine

## 2012-10-13 ENCOUNTER — Encounter: Payer: Self-pay | Admitting: Internal Medicine

## 2012-10-13 VITALS — BP 112/60 | HR 84 | Temp 98.9°F | Ht 69.0 in | Wt 165.0 lb

## 2012-10-13 DIAGNOSIS — R42 Dizziness and giddiness: Secondary | ICD-10-CM

## 2012-10-13 DIAGNOSIS — M1990 Unspecified osteoarthritis, unspecified site: Secondary | ICD-10-CM

## 2012-10-13 DIAGNOSIS — R634 Abnormal weight loss: Secondary | ICD-10-CM

## 2012-10-13 DIAGNOSIS — M129 Arthropathy, unspecified: Secondary | ICD-10-CM

## 2012-10-13 DIAGNOSIS — G4730 Sleep apnea, unspecified: Secondary | ICD-10-CM

## 2012-10-13 LAB — CBC WITH DIFFERENTIAL/PLATELET
Basophils Absolute: 0 10*3/uL (ref 0.0–0.1)
Basophils Relative: 0 % (ref 0–1)
Eosinophils Absolute: 0.1 10*3/uL (ref 0.0–0.7)
Eosinophils Relative: 1 % (ref 0–5)
Lymphocytes Relative: 41 % (ref 12–46)
MCHC: 33.8 g/dL (ref 30.0–36.0)
MCV: 88.7 fL (ref 78.0–100.0)
Monocytes Absolute: 0.9 10*3/uL (ref 0.1–1.0)
Platelets: 460 10*3/uL — ABNORMAL HIGH (ref 150–400)
RDW: 13.8 % (ref 11.5–15.5)
WBC: 9.1 10*3/uL (ref 4.0–10.5)

## 2012-10-13 LAB — COMPREHENSIVE METABOLIC PANEL
ALT: 15 U/L (ref 0–35)
AST: 18 U/L (ref 0–37)
CO2: 29 mEq/L (ref 19–32)
Chloride: 102 mEq/L (ref 96–112)
Sodium: 141 mEq/L (ref 135–145)
Total Bilirubin: 0.2 mg/dL — ABNORMAL LOW (ref 0.3–1.2)
Total Protein: 7.2 g/dL (ref 6.0–8.3)

## 2012-10-15 ENCOUNTER — Encounter: Payer: Self-pay | Admitting: Internal Medicine

## 2012-10-15 NOTE — Assessment & Plan Note (Signed)
Stable.  Has had extensive w/up.  Follow.  

## 2012-10-15 NOTE — Assessment & Plan Note (Signed)
Continues on CPAP.  Working with [facility] to regulate her mask.  Follow.

## 2012-10-15 NOTE — Progress Notes (Signed)
Subjective:    Patient ID: Melinda [name], female    DOB: 1955/11/10, 57 y.o.   MRN: [id number]  HPI 57 year old female with past history of hypercholesterolemia and vertigo (s/p extensive w/up - followed by neurology) who comes in today as a work in appt.  She was in the ER 05/14/12 for "weakness".  She also states at that time - her legs were aching.  Labs, EKG (per pt) unrevealing.  She did have a slightly decreased potassium - 3.3.  She saw Dr Gwen Pounds 05/16/12 for a follow up.  Had a stress test and states she was told "everything was ok".  She comes in today stating that she saw Dr Cheree Ditto and was given a cream (Triamcinolone/Cerave).  She states she started this one week ago and noticed that the skin on her arms and legs burned.  States she could not wear clothes without it being irritated.  She stopped the cream and now is feeling better.  Skin is better.  Cannot appreciate any rash.  Had recent gyn surgery.  Had a catheter for a brief period.  States she is urinating fine now.  Dr Jean Rosenthal has released her.  Bowels ok.  Does notice at times that her fingers and toes will be cold.  She describes as ice cold.    Past Medical History  Diagnosis Date  . Arthritis   . Vertigo   . Hypercholesterolemia   . Sleep apnea     Review of Systems Patient denies any headache.  Dizziness/light headedness - unchanged.  Has been worked up extensively.  Saw ENT and Neurology.   No palpitations.  Denies any significant chest pain.  No increased shortness of breath, cough or congestion.  No nausea or vomiting.  No abdominal pain or cramping.  No bowel change, such as diarrhea, constipation, BRBPR or melana.  No urine change now.  Urinating without difficulty.   No vaginal symptoms.  Skin is doing better.  No rash.  Does report increased fatigue.  Probable multifactorial.  Husband has been in the hospital recently.      Objective:   Physical Exam  Filed Vitals:   10/13/12 1440  BP: 112/60  Pulse: 84   Temp: 98.9 F (23.16 C)   57 year old female in no acute distress.   HEENT:  Nares - clear.  OP- without lesions or erythema.  NECK:  Supple, nontender.  No audible carotid bruit.   HEART:  Appears to be regular. LUNGS:  Without crackles or wheezing audible.  Respirations even and unlabored.   RADIAL PULSE:  Equal bilaterally.  ABDOMEN:  Soft.  Bowel sounds present and normal.  Non tender.   No audible abdominal bruit.   EXTREMITIES:  No increased edema to be present.  DP pulses palpable and equal bilaterally.                     Assessment & Plan:  INCREASED ABDOMINAL BLOATING AND GAS.  Has improved.  S/p gyn surgery.  Obtain all records.  Currently doing well.    GU.  Denies any problems with urination.  Cancelled her appt with Dr Achilles Dunk.    CARDIOVASCULAR.  Just saw Dr Gwen Pounds.  Per his note, the stress test revealed normal LV function at peak stress.  There were changes in the inferior intraseptal area.  These changes were not associated with symptoms or EKG changes.  He did not feel this was clinically significant and did not feel further cardiac  w/up warranted.  Currently doing well.  Follow.    INCREASED PSYCHOSOCIAL STRESSORS.  Appears to be stable.  Still dealing with the increased stress of her husbands medical issues.    DERMATOLOGY.  The burning has resolved.  No rash.  Follow up with Dr Cheree Ditto.   FATIGUE.  She does report increased fatigue.  Probably multifactorial.  Her husband has been in the hospital.  Will check cbc, met c and tsh.

## 2012-10-15 NOTE — Assessment & Plan Note (Signed)
Relatively stable. 

## 2012-10-16 ENCOUNTER — Telehealth: Payer: Self-pay | Admitting: Internal Medicine

## 2012-10-16 DIAGNOSIS — E7800 Pure hypercholesterolemia, unspecified: Secondary | ICD-10-CM

## 2012-10-16 DIAGNOSIS — D473 Essential (hemorrhagic) thrombocythemia: Secondary | ICD-10-CM

## 2012-10-16 NOTE — Telephone Encounter (Signed)
Fasting labs 4/24 pt aware

## 2012-10-16 NOTE — Telephone Encounter (Signed)
Pt notified of lab results and need for follow up fasting lab.  Please schedule a fasting lab appt in 1 month and notify pt

## 2012-10-18 ENCOUNTER — Telehealth: Payer: Self-pay | Admitting: *Deleted

## 2012-10-18 NOTE — Telephone Encounter (Signed)
Called patient to let her know her results were released to mychart.

## 2012-10-26 ENCOUNTER — Encounter: Payer: Self-pay | Admitting: Internal Medicine

## 2012-10-27 ENCOUNTER — Ambulatory Visit (INDEPENDENT_AMBULATORY_CARE_PROVIDER_SITE_OTHER): Admitting: Internal Medicine

## 2012-10-27 ENCOUNTER — Encounter: Admitting: Internal Medicine

## 2012-10-27 ENCOUNTER — Encounter: Payer: Self-pay | Admitting: Internal Medicine

## 2012-10-27 VITALS — BP 100/60 | HR 83 | Temp 98.1°F | Ht 69.0 in | Wt 168.5 lb

## 2012-10-27 DIAGNOSIS — Z1211 Encounter for screening for malignant neoplasm of colon: Secondary | ICD-10-CM

## 2012-10-27 DIAGNOSIS — M1990 Unspecified osteoarthritis, unspecified site: Secondary | ICD-10-CM

## 2012-10-27 DIAGNOSIS — E7800 Pure hypercholesterolemia, unspecified: Secondary | ICD-10-CM

## 2012-10-27 DIAGNOSIS — Z1239 Encounter for other screening for malignant neoplasm of breast: Secondary | ICD-10-CM

## 2012-10-27 DIAGNOSIS — M129 Arthropathy, unspecified: Secondary | ICD-10-CM

## 2012-10-27 DIAGNOSIS — R42 Dizziness and giddiness: Secondary | ICD-10-CM

## 2012-10-27 DIAGNOSIS — G4730 Sleep apnea, unspecified: Secondary | ICD-10-CM

## 2012-10-29 ENCOUNTER — Encounter: Payer: Self-pay | Admitting: Internal Medicine

## 2012-10-29 DIAGNOSIS — E7800 Pure hypercholesterolemia, unspecified: Secondary | ICD-10-CM | POA: Insufficient documentation

## 2012-10-29 NOTE — Progress Notes (Signed)
Subjective:    Patient ID: Melinda [name], female    DOB: 03-11-1956, 57 y.o.   MRN: [id number]  HPI 57 year old female with past history of hypercholesterolemia and vertigo (s/p extensive w/up - followed by neurology) who comes in today to follow up on these issues as well as for a complete physical exam.  Recently had gyn surgery to remove a "mass".  Benign.  States everything checked out fine.  Has been released from gyn.  Was to see urology, but states her urination is fine now.  No urinary issues.  No abdominal pain or cramping.  Her main concern is a rash - persistent.  Has seen dermatology.  Cream they gave - she reports - made her skin burn.  Plans to follow up with them regarding this.  Had lost weight.  Weight is up three pounds from the last visit.  No nausea or vomiting. No bowel change.       Past Medical History  Diagnosis Date  . Arthritis   . Vertigo   . Hypercholesterolemia   . Sleep apnea     Current Outpatient Prescriptions on File Prior to Visit  Medication Sig Dispense Refill  . Calcium Carb-Cholecalciferol (CALCIUM-VITAMIN D) 600-400 MG-UNIT TABS Take 1 tablet by mouth daily.      . Cranberry 1000 MG CAPS Take 1 capsule by mouth daily.      . Cyanocobalamin (VITAMIN B 12 PO) Take 1,000 mg by mouth daily.      . meclizine (ANTIVERT) 25 MG tablet Take 25 mg by mouth as needed.      . Multiple Vitamin (MULTIVITAMIN) tablet 1/2 tablet bid       No current facility-administered medications on file prior to visit.    Review of Systems Patient denies any headache.  Dizziness/light headedness - unchanged.  Has been worked up extensively.  Saw ENT and Neurology.   No palpitations.  Denies any chest pain.  No increased shortness of breath, cough or congestion.  No nausea or vomiting.  No abdominal pain or cramping.  No bowel change, such as diarrhea, constipation, BRBPR or melana.  No urine change now.  Urinating without difficulty.   No vaginal symptoms.  Increased  stress.  Husband has been in the hospital recently.  Feels she is handling things relatively well.      Objective:   Physical Exam  Filed Vitals:   10/27/12 1423  BP: 100/60  Pulse: 83  Temp: 98.1 F (36.7 C)   Blood pressure recheck:  32/74  57 year old female in no acute distress.   HEENT:  Nares- clear.  Oropharynx - without lesions. NECK:  Supple.  Nontender.  No audible bruit.  HEART:  Appears to be regular. LUNGS:  No crackles or wheezing audible.  Respirations even and unlabored.  RADIAL PULSE:  Equal bilaterally.    BREASTS:  She declined.   ABDOMEN:  Soft, nontender.  Bowel sounds present and normal.  No audible abdominal bruit.  GU:  Performed through gyn.    EXTREMITIES:  No increased edema present.  DP pulses palpable and equal bilaterally.      SKIN:  Small pin point red lesions (few scattered - arm and upper back).                        Assessment & Plan:  GYN.  S/p surgery.  States everything checked out fine.  Benign.  Has been released.  Still need to  obtain records.  Currently doing well.    WEIGHT LOSS.  Weight up three pounds from last check.  Follow.  Feeling better.   GU.  Denies any problems with urination.  Cancelled her appt with Dr Achilles Dunk.    CARDIOVASCULAR.  Just saw Dr Gwen Pounds.  Per his note, the stress test revealed normal LV function at peak stress.  There were changes in the inferior intraseptal area.  These changes were not associated with symptoms or EKG changes.  He did not feel this was clinically significant and did not feel further cardiac w/up warranted.  Currently doing well.  Follow.    INCREASED PSYCHOSOCIAL STRESSORS.  Appears to be stable.  Still dealing with the increased stress of her husbands medical issues.    DERMATOLOGY.  The burning has resolved.  Faint rash persistent.  Follow up with Dr Cheree Ditto.   HEALTH MAINTENANCE.  Physical today.  GYN exam - through [facility].  She declined breast exam.  Schedule mammogram.  Reports up  to date with gyn evaluation.

## 2012-10-29 NOTE — Assessment & Plan Note (Signed)
Stable.  Has had extensive w/up.  Follow.  

## 2012-10-29 NOTE — Assessment & Plan Note (Signed)
Relatively stable. 

## 2012-10-29 NOTE — Assessment & Plan Note (Signed)
Low cholesterol diet and exercise.  Check lipid panel with next labs.  

## 2012-10-29 NOTE — Assessment & Plan Note (Signed)
Continues on CPAP.   Follow.        

## 2012-11-02 ENCOUNTER — Other Ambulatory Visit (INDEPENDENT_AMBULATORY_CARE_PROVIDER_SITE_OTHER)

## 2012-11-02 DIAGNOSIS — E7800 Pure hypercholesterolemia, unspecified: Secondary | ICD-10-CM

## 2012-11-02 DIAGNOSIS — D473 Essential (hemorrhagic) thrombocythemia: Secondary | ICD-10-CM

## 2012-11-02 LAB — CBC WITH DIFFERENTIAL/PLATELET
Basophils Absolute: 0 10*3/uL (ref 0.0–0.1)
Eosinophils Absolute: 0 10*3/uL (ref 0.0–0.7)
Lymphocytes Relative: 29.7 % (ref 12.0–46.0)
MCHC: 32.6 g/dL (ref 30.0–36.0)
MCV: 92.5 fl (ref 78.0–100.0)
Monocytes Absolute: 0.7 10*3/uL (ref 0.1–1.0)
Neutrophils Relative %: 62.1 % (ref 43.0–77.0)
Platelets: 423 10*3/uL — ABNORMAL HIGH (ref 150.0–400.0)
RBC: 4.36 Mil/uL (ref 3.87–5.11)
RDW: 13.6 % (ref 11.5–14.6)

## 2012-11-02 LAB — LIPID PANEL
Cholesterol: 200 mg/dL (ref 0–200)
HDL: 42.1 mg/dL (ref >39.00)
Triglycerides: 101 mg/dL (ref 0.0–149.0)

## 2012-11-03 ENCOUNTER — Ambulatory Visit: Payer: Self-pay | Admitting: Internal Medicine

## 2012-11-03 ENCOUNTER — Telehealth: Payer: Self-pay | Admitting: Internal Medicine

## 2012-11-03 DIAGNOSIS — D696 Thrombocytopenia, unspecified: Secondary | ICD-10-CM

## 2012-11-03 NOTE — Telephone Encounter (Signed)
Transferred to Call A Nurse.

## 2012-11-03 NOTE — Telephone Encounter (Signed)
Patient Information:  Caller Name: Melinda [name]  Phone: [phone]  Patient: Melinda [name]  Gender: Female  DOB: 28-Nov-1955  Age: 57 Years  PCP: Dale Washoe  Office Follow Up:  Does the office need to follow up with this patient?: No  Instructions For The Office: N/A  RN Note:  Intermittent itching. Caller denies rash. Cream has been stopped per caller. Caller states there is also burning and some redness to skin. Caller believes the warmth to her skin is from a steriod cream she has used. Caller is eating, drinking, voiding, and interacting within normal limits. Caller would like other remedies to help soothe her skin. Caller will try baking soda bath and aloe vera and call back with further symptoms.  Symptoms  Reason For Call & Symptoms: Caller states skin is on fire  Reviewed Health History In EMR: Yes  Reviewed Medications In EMR: Yes  Reviewed Allergies In EMR: Yes  Reviewed Surgeries / Procedures: Yes  Date of Onset of Symptoms: Unknown  Guideline(s) Used:  No Protocol Available - Information Only  Disposition Per Guideline:   Home Care  Reason For Disposition Reached:   Information only question and nurse able to answer  Advice Given:  Call Back If:  New symptoms develop  You become worse.  Patient Will Follow Care Advice:  YES

## 2012-11-03 NOTE — Telephone Encounter (Signed)
Pt notified of lab results and need for a follow up lab appt in 2 months. Please schedule a follow up non fasting lab appt in 2 months.   Thanks.

## 2012-11-06 ENCOUNTER — Ambulatory Visit (INDEPENDENT_AMBULATORY_CARE_PROVIDER_SITE_OTHER): Admitting: Adult Health

## 2012-11-06 ENCOUNTER — Encounter: Payer: Self-pay | Admitting: General Practice

## 2012-11-06 ENCOUNTER — Encounter: Payer: Self-pay | Admitting: Adult Health

## 2012-11-06 VITALS — BP 120/72 | HR 88 | Temp 98.1°F | Resp 16

## 2012-11-06 DIAGNOSIS — R21 Rash and other nonspecific skin eruption: Secondary | ICD-10-CM | POA: Insufficient documentation

## 2012-11-06 MED ORDER — DOXYCYCLINE HYCLATE 100 MG PO TABS: 100.0000 mg | ORAL_TABLET | Freq: Two times a day (BID) | ORAL | Status: DC

## 2012-11-06 NOTE — Patient Instructions (Addendum)
  Start the doxycycline antibiotic today - take 1 tablet twice daily for 10 days.  Take an over-the-counter antihistamine such as zyrtec or claritin daily for the next 1-2 weeks.  Continue with the hydrocortisone cream.  You may also want to try Aveno products - such as lotion and wash

## 2012-11-06 NOTE — Assessment & Plan Note (Signed)
Patient does not do well with prednisone. I have suggested for her to take an antihistamine. Continue the hydrocodone 2-1/2% cream to affected areas. I am also treating her with doxycycline prophylactically for her recent fever and concerns of possible flea or tick bites

## 2012-11-06 NOTE — Progress Notes (Signed)
  Subjective:    Patient ID: Melinda [name], female    DOB: 01-25-1956, 57 y.o.   MRN: [id number]  HPI  Patient is a pleasant 57 year old female who presents to clinic with complaints of scattered rash. She denies being outside in the yard. She denies taking any new medications. Patient has a pet that is treated for fleas. She has not seen any fleas or ticks on her pet. She reports having a fever within the past 2 weeks. Patient is asplenic. Patient has been using topical hydrocortisone cream. She denies itching. She denies shortness of breath, chest tightness or headaches.   Current Outpatient Prescriptions on File Prior to Visit  Medication Sig Dispense Refill  . Calcium Carb-Cholecalciferol (CALCIUM-VITAMIN D) 600-400 MG-UNIT TABS Take 1 tablet by mouth daily.      . Cranberry 1000 MG CAPS Take 500 mg by mouth daily.       . Cyanocobalamin (VITAMIN B 12 PO) Take 1,000 mg by mouth daily.      . meclizine (ANTIVERT) 25 MG tablet Take 12.5 mg by mouth 2 (two) times daily as needed.       . Multiple Vitamin (MULTIVITAMIN) tablet Take 1 tablet by mouth daily.        No current facility-administered medications on file prior to visit.      Review of Systems  Constitutional: Positive for fever.       Fever within the past 2 weeks x 2. (101.0)  HENT: Negative.   Respiratory: Negative.   Cardiovascular: Negative.   Gastrointestinal: Negative.   Skin: Positive for rash.       Rash on the arms and chest.  Allergic/Immunologic: Positive for immunocompromised state.       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  Neurological: Negative.     BP 120/72  Pulse 88  Temp(Src) 98.1 F (36.7 C) (Oral)  Resp 16     Objective:   Physical Exam  Constitutional: She is oriented to person, place, and time. She appears well-developed and well-nourished.  Cardiovascular: Normal rate and regular rhythm.   Pulmonary/Chest: Effort normal and breath sounds normal.  Neurological: She is alert and  oriented to person, place, and time.  Skin: Skin is warm and dry. Rash noted.  Rash is diffuse on lower portion of her arms. There are no hives. She also has multiple pin point red areas scattered on arms, upper back, and chest. These appear similar to flea bites.  Psychiatric: She has a normal mood and affect. Her behavior is normal. Judgment and thought content normal.       Assessment & Plan:

## 2012-11-07 ENCOUNTER — Telehealth: Payer: Self-pay | Admitting: Internal Medicine

## 2012-11-07 ENCOUNTER — Emergency Department: Payer: Self-pay | Admitting: Emergency Medicine

## 2012-11-07 NOTE — Telephone Encounter (Signed)
Called patient, states took Doxycycline and Zyrtec at 9:00 am today. At 9:30 am, became dizzy and nauseous, "shaky", vomited x 1. At home with son. Pt has been drinking lots of water and have kept feet elevated. States shakiness and dizziness is subsiding, feeling better, just slightly shaky. Pt has BP cuff at home, but not currently working. Son thinks pt had a problem with Doxycycline in the past, but unsure of side effect. Pt states "lives in vertigo" last 4 years. States Meclizine is no longer helpful. Advised to call back if condition changes or worsens. Please advise.

## 2012-11-07 NOTE — Telephone Encounter (Signed)
Please call patient and tell her that tetracyclines (doxy) can cause upset stomach. Tell her to take with food and no other medication so that we can evaluate if this is the cause. Common reaction of zyrtec is dizziness. She may want to try another antihistamine or take it at bedtime. Patient needs to be seen if symptoms worsen.

## 2012-11-07 NOTE — Telephone Encounter (Signed)
Called patient and she stated she was on the way to the [facility] ER, driven by her son who is an EMT, for ? Reaction to the doxycycline, complaining of tongue swelling.

## 2012-11-07 NOTE — Telephone Encounter (Signed)
Patient Information:  Caller Name: Melinda [name]  Phone: [phone]  Patient: Melinda [name], Melinda [name]  Gender: Female  DOB: 11-21-1955  Age: 57 Years  PCP: Melinda [name]  Office Follow Up:  Does the office need to follow up with this patient?: Yes  Instructions For The Office: Patient having side effects? medication?  Anxious Jittery and shaking .  RN Note:  Reviewed Doxycycline Side Effects per Health Education.   Contacted office and spoke with Melinda [name] and Melinda [name].  Advised to forward a Triage and they will discuss with Melinda and get in touch with patient  Symptoms  Reason For Call & Symptoms: Patient states she was started on Doxycycline and Zyrtec for a rash yesterday . Saw Melinda Rey NP. She took the medication as directed. She has had x3 doses .  She states today, 11/07/12. She is very jittery/nervousness , sitting on edge of bed shaking. Slight dizzy. Emesis x1 today and nausea.  She feels like she cannot come to the office because she cannot drive and is beginning to calm during triage .  No chest pain or shortness of breath only symptoms is  "shaking".  Her son is at home with her and is EMT but is sleeping.   Rash is nearly gone, no facial swelling or discomfort.  Reviewed Health History In EMR: Yes  Reviewed Medications In EMR: Yes  Reviewed Allergies In EMR: Yes  Reviewed Surgeries / Procedures: Yes  Date of Onset of Symptoms: 11/07/2012  Treatments Tried: Doxycycline and zyrtec x2 doses  Treatments Tried Worked: No  Guideline(s) Used:  Dizziness  Disposition Per Guideline:   See Today in Office  Reason For Disposition Reached:   Vomiting occurs with dizziness  Advice Given:  Drink Fluids:  Drink several glasses of fruit juice, other clear fluids, or water. This will improve hydration and blood glucose. If you have a fever or have had heat exposure, make sure the fluids are cold.  Cool Off:  If the weather is hot, apply a cold compress to the forehead or take a cool shower  or bath.  Some Causes of Temporary Dizziness:  Poor Fluid Intake - Not drinking enough fluids and being a little dehydrated is a common cause of temporary dizziness. This is always worse during hot weather.  Standing Up Suddenly - Standing up suddenly (especially getting out of bed) or prolonged standing in one place are common causes of temporary dizziness. Not drinking enough fluids always makes it worse. Certain medications can cause or increase this type of dizziness (e.g., blood pressure medications).  Rest for 1-2 Hours:  Lie down with feet elevated for 1 hour. This will improve blood flow and increase blood flow to the brain.  Stand Up Slowly:  In the mornings, sit up for a few minutes before you stand up. That will help your blood flow make the adjustment.  If you have to stand up for long periods of time, contract and relax your leg muscles to help pump the blood back to the heart.  Sit down or lie down if you feel dizzy.  Call Back If:  Still feel dizzy after 2 hours of rest and fluids  Passes out (faints)  You become worse.  RN Overrode Recommendation:  Follow Up With Office Later  Patient states she cannot come to the office.  forward concerns to Melinda [name] to discuss with Melinda Govern NP

## 2012-11-08 ENCOUNTER — Telehealth: Payer: Self-pay | Admitting: Internal Medicine

## 2012-11-13 ENCOUNTER — Telehealth: Payer: Self-pay | Admitting: Internal Medicine

## 2012-11-13 NOTE — Telephone Encounter (Signed)
Pt called back she states that her reaction is better. She was concerned when taking doxycycline a paper from the pharmacy said something about not taking any diary or vitamins while taking doxycycline. She done taking doxycycline as of now and wants to know if she can start back taking her vitamin medications.

## 2012-11-13 NOTE — Telephone Encounter (Signed)
Please Advise.... Pt called said she is doing better from her reaction from doxycycline pt called wanting to know if she can start  Back on her vitamins  Calcium carb 600-400mg  unit tab Calcium 1200mg /1000iu vit d

## 2012-11-13 NOTE — Telephone Encounter (Signed)
Pt called she is doing better from her reaction from doxycyclinePt called wanting to know if she can start back with her vitiamins  calcium carb  600-400mg  unit tab calscum 1200mg /1000iu vit d

## 2012-11-13 NOTE — Telephone Encounter (Signed)
Ok to start back

## 2012-11-13 NOTE — Telephone Encounter (Signed)
Notified patient she can start back taking her vitamins.

## 2012-11-13 NOTE — Telephone Encounter (Signed)
Why were these meds stopped.  Unless she was having some issue with these meds, she should be able to restart.

## 2012-11-15 ENCOUNTER — Emergency Department: Payer: Self-pay | Admitting: Emergency Medicine

## 2012-11-15 LAB — URINALYSIS, COMPLETE
Blood: NEGATIVE
Glucose,UR: NEGATIVE mg/dL (ref 0–75)
Ketone: NEGATIVE
Nitrite: NEGATIVE
Ph: 9 (ref 4.5–8.0)
RBC,UR: 2 /HPF (ref 0–5)
Specific Gravity: 1.017 (ref 1.003–1.030)
Transitional Epi: 1
WBC UR: 13 /HPF (ref 0–5)

## 2012-11-15 LAB — COMPREHENSIVE METABOLIC PANEL
Albumin: 3.8 g/dL (ref 3.4–5.0)
Alkaline Phosphatase: 70 U/L (ref 50–136)
Anion Gap: 4 — ABNORMAL LOW (ref 7–16)
BUN: 17 mg/dL (ref 7–18)
Bilirubin,Total: 0.6 mg/dL (ref 0.2–1.0)
Co2: 32 mmol/L (ref 21–32)
EGFR (African American): >60
Glucose: 115 mg/dL — ABNORMAL HIGH (ref 65–99)
Osmolality: 276 (ref 275–301)
Potassium: 4 mmol/L (ref 3.5–5.1)
SGOT(AST): 19 U/L (ref 15–37)
SGPT (ALT): 22 U/L (ref 12–78)
Sodium: 137 mmol/L (ref 136–145)

## 2012-11-15 LAB — CBC
HCT: 42.8 % (ref 35.0–47.0)
HGB: 14.3 g/dL (ref 12.0–16.0)
MCH: 30.2 pg (ref 26.0–34.0)
MCHC: 33.3 g/dL (ref 32.0–36.0)
Platelet: 452 10*3/uL — ABNORMAL HIGH (ref 150–440)
RBC: 4.73 10*6/uL (ref 3.80–5.20)
RDW: 13.7 % (ref 11.5–14.5)
WBC: 16 10*3/uL — ABNORMAL HIGH (ref 3.6–11.0)

## 2012-11-16 ENCOUNTER — Other Ambulatory Visit

## 2012-11-19 ENCOUNTER — Emergency Department: Payer: Self-pay | Admitting: Emergency Medicine

## 2012-11-20 ENCOUNTER — Telehealth: Payer: Self-pay | Admitting: Internal Medicine

## 2012-11-20 NOTE — Telephone Encounter (Signed)
Pt scheduled next 30 min opening 5/12 @ 11:15.  Pt asking to please contact her to discuss or see if appt can be moved sooner.

## 2012-11-20 NOTE — Telephone Encounter (Signed)
Would you like to see this patient sooner?

## 2012-11-20 NOTE — Telephone Encounter (Signed)
Pt informing that she has just been released from the hospital.  States she had a bowel blockage/impaction and "they had to actually go in and dig the thing out".  Will schedule pt for f/u.

## 2012-11-20 NOTE — Telephone Encounter (Signed)
I can see her at 11:30 on 5/11/4.  Please call her and confirm doing ok.  Also need hospital records.  Thanks.

## 2012-11-21 NOTE — Telephone Encounter (Signed)
Spoke with pt & moved appt from 5/12 to 5/1

## 2012-11-23 ENCOUNTER — Ambulatory Visit (INDEPENDENT_AMBULATORY_CARE_PROVIDER_SITE_OTHER): Admitting: Internal Medicine

## 2012-11-23 ENCOUNTER — Encounter: Payer: Self-pay | Admitting: Internal Medicine

## 2012-11-23 VITALS — BP 110/70 | HR 70 | Temp 98.1°F | Ht 69.0 in | Wt 162.5 lb

## 2012-11-23 DIAGNOSIS — R42 Dizziness and giddiness: Secondary | ICD-10-CM

## 2012-11-23 DIAGNOSIS — G4730 Sleep apnea, unspecified: Secondary | ICD-10-CM

## 2012-11-26 ENCOUNTER — Encounter: Payer: Self-pay | Admitting: Internal Medicine

## 2012-11-26 NOTE — Assessment & Plan Note (Signed)
Continues on CPAP.   Follow.

## 2012-11-26 NOTE — Progress Notes (Signed)
Subjective:    Patient ID: Melinda [name], female    DOB: Oct 20, 1955, 57 y.o.   MRN: [id number]  HPI 57 year old female with past history of hypercholesterolemia and vertigo (s/p extensive w/up - followed by neurology) who comes in today for an ER follow up. Recently had gyn surgery to remove a "mass". Benign.  States everything checked out fine.  Has been released from gyn.  Was to see urology, but states her urination was fine, so she cancelled her appt.   Recently had a uti.  On omnicef now.  Was seen in the ER on 11/20/12 for fecal impaction.  States it had been a week since she had a bowel movement.  Nothing was working at home.  In ER XRay revealed constipation.  There was no evidence of perforation or of a small bowel obstruction.  She was disimpacted.  After this, bowels have been moving fine.  No blood. No abdominal pain or cramping.  She relates her bowel issues to taking doxycycline.  Overall now, she is feeling better.       Past Medical History  Diagnosis Date  . Arthritis   . Vertigo   . Hypercholesterolemia   . Sleep apnea     Current Outpatient Prescriptions on File Prior to Visit  Medication Sig Dispense Refill  . Calcium Carb-Cholecalciferol (CALCIUM-VITAMIN D) 600-400 MG-UNIT TABS Take 1 tablet by mouth daily.      . Cyanocobalamin (VITAMIN B 12 PO) Take 1,000 mg by mouth daily.      . meclizine (ANTIVERT) 25 MG tablet Take 12.5 mg by mouth 2 (two) times daily as needed.       . Multiple Vitamin (MULTIVITAMIN) tablet Take 1 tablet by mouth daily.       . Cranberry 1000 MG CAPS Take 500 mg by mouth daily.        No current facility-administered medications on file prior to visit.    Review of Systems Patient denies any headache.  Dizziness/light headedness - unchanged.  Has been worked up extensively.  Saw ENT and Neurology.   No palpitations.  Denies any chest pain.  No increased shortness of breath, cough or congestion.  No nausea or vomiting.  No abdominal pain  or cramping.  No BRBPR or melana.  Feels her bowels are back to normal now.  No urine change now.  Urinating without difficulty.   On omnicef for infection.  No vaginal symptoms.  Still dealing with the increased stress.      Objective:   Physical Exam  Filed Vitals:   11/23/12 1136  BP: 110/70  Pulse: 70  Temp: 98.1 F (36.7 C)   Blood pressure recheck:  20/18  57 year old female in no acute distress.   HEENT:  Nares- clear.  Oropharynx - without lesions. NECK:  Supple.  Nontender.  No audible bruit.  HEART:  Appears to be regular. LUNGS:  No crackles or wheezing audible.  Respirations even and unlabored.  RADIAL PULSE:  Equal bilaterally.  ABDOMEN:  Soft, nontender.  Bowel sounds present and normal.  No audible abdominal bruit.   EXTREMITIES:  No increased edema present.  DP pulses palpable and equal bilaterally.       Assessment & Plan:  CONSTIPATION.  S/p disimpaction.  Doing well.  Bowels doing well.  Follow.   GYN.  S/p surgery.  States everything checked out fine.  Benign.  Has been released.  Still need to obtain records.  Currently doing well.  WEIGHT LOSS.  Weight has fluctuated recently.  Follow.  Bowels back to normal.  Eating better.   GU.  Denies any problems with urination.  Cancelled her appt with Dr Achilles Dunk.  Being treated now for infection.  Follow.   CARDIOVASCULAR.  Just saw Dr Gwen Pounds.  Per his note, the stress test revealed normal LV function at peak stress.  There were changes in the inferior intraseptal area.  These changes were not associated with symptoms or EKG changes.  He did not feel this was clinically significant and did not feel further cardiac w/up warranted.  Currently doing well.  Follow.    INCREASED PSYCHOSOCIAL STRESSORS.  Appears to be stable.  Still dealing with the increased stress of her husbands medical issues.    HEALTH MAINTENANCE.  Physical last visit.  GYN exam - through [facility].  She declined breast exam.  Should be scheduled  for a mammogram.  Reports up to date with gyn evaluation.

## 2012-11-26 NOTE — Assessment & Plan Note (Signed)
Stable.  Has had extensive w/up.  Follow.  

## 2012-11-27 ENCOUNTER — Ambulatory Visit (INDEPENDENT_AMBULATORY_CARE_PROVIDER_SITE_OTHER): Admitting: Internal Medicine

## 2012-11-27 ENCOUNTER — Ambulatory Visit: Admitting: Adult Health

## 2012-11-27 ENCOUNTER — Encounter: Payer: Self-pay | Admitting: Internal Medicine

## 2012-11-27 VITALS — BP 112/80 | HR 76 | Temp 98.3°F | Ht 66.0 in | Wt 165.5 lb

## 2012-11-27 DIAGNOSIS — R21 Rash and other nonspecific skin eruption: Secondary | ICD-10-CM

## 2012-11-27 DIAGNOSIS — R42 Dizziness and giddiness: Secondary | ICD-10-CM

## 2012-11-28 ENCOUNTER — Encounter: Payer: Self-pay | Admitting: Internal Medicine

## 2012-11-28 NOTE — Progress Notes (Signed)
Subjective:    Patient ID: Melinda [name], female    DOB: Jul 30, 1955, 57 y.o.   MRN: [id number]  Rash  57 year old female with past history of hypercholesterolemia and vertigo (s/p extensive w/up - followed by neurology) who comes in today as a work in with concerns regarding a persistent rash.  Recently had gyn surgery to remove a "mass". Benign.  States everything checked out fine.  Has been released from gyn.  Was to see urology, but states her urination was fine, so she cancelled her appt.   Recently had a uti.  On omnicef now.  Was seen in the ER on 11/20/12 for fecal impaction.   In ER XRay revealed constipation.  There was no evidence of perforation or of a small bowel obstruction.  She was disimpacted.  After this, bowels have been moving fine.  No blood. No abdominal pain or cramping.  She relates her bowel issues to taking doxycycline.  I just saw her last week.  Was doing better.  Still reports bowels are doing fine.  She is here today secondary to a rash.  States it has been present for 11/2 - 2 weeks.  Started on both arms.  Now involves her neck, chin and face and now on her back.  No itching.  States it burns.  Does not involve her legs or stomach.  Off abx now.  Took her last abx 2 days ago.  No fever.  No headache.       Past Medical History  Diagnosis Date  . Arthritis   . Vertigo   . Hypercholesterolemia   . Sleep apnea     Current Outpatient Prescriptions on File Prior to Visit  Medication Sig Dispense Refill  . Calcium Carb-Cholecalciferol (CALCIUM-VITAMIN D) 600-400 MG-UNIT TABS Take 1 tablet by mouth daily.      . Cranberry 1000 MG CAPS Take 500 mg by mouth daily.       . Cyanocobalamin (VITAMIN B 12 PO) Take 1,000 mg by mouth daily.      . meclizine (ANTIVERT) 25 MG tablet Take 12.5 mg by mouth 2 (two) times daily as needed.       . Multiple Vitamin (MULTIVITAMIN) tablet Take 1 tablet by mouth daily.        No current facility-administered medications on file  prior to visit.    Review of Systems  Skin: Positive for rash.  Patient denies any headache.  Dizziness/light headedness - unchanged.  Has been worked up extensively.  Saw ENT and Neurology.   No palpitations.  Denies any chest pain.  No increased shortness of breath, cough or congestion.  No nausea or vomiting.  No abdominal pain or cramping.  No BRBPR or melana.  Feels her bowels are back to normal now.  No urine change now.  Urinating without difficulty.   Rash as outlined.  No itching.  Describes it as burning.  Has been using hydrocortisone cream.      Objective:   Physical Exam  Filed Vitals:   11/27/12 1554  BP: 112/80  Pulse: 76  Temp: 98.3 F (74.21 C)   57 year old female in no acute distress.   HEENT:  Nares- clear.  Oropharynx - without lesions. NECK:  Supple.  Nontender.  No audible bruit.  HEART:  Appears to be regular. LUNGS:  No crackles or wheezing audible.  Respirations even and unlabored.  RADIAL PULSE:  Equal bilaterally.  ABDOMEN:  Soft, nontender.  Bowel sounds present and normal.  No audible abdominal bruit.   EXTREMITIES:  No increased edema present.  DP pulses palpable and equal bilaterally.  SKIN:  Faint erythematous rash - arms, neck and upper back.  No rash on her legs, stomach or lower back.        Assessment & Plan:  RASH.  Unclear as to the exact etiology.  Appears to be more of an allergic type of rash.  Does not involve her legs, stomach or lower back.  Will have her take zyrtec as directed.  Refer back to dermatology for evaluation.    CONSTIPATION.  S/p disimpaction.  Doing well.  Bowels doing well.  Follow.   GYN.  S/p surgery.  States everything checked out fine.  Benign.  Has been released.  Still need to obtain records.  Currently doing well.    WEIGHT LOSS.  Weight has fluctuated recently.  Follow.  Bowels back to normal.  Eating better.   GU.  Denies any problems with urination.  Cancelled her appt with Dr Achilles Dunk.  Being treated now for  infection.  Follow.   CARDIOVASCULAR.  Just saw Dr Gwen Pounds.  Per his note, the stress test revealed normal LV function at peak stress.  There were changes in the inferior intraseptal area.  These changes were not associated with symptoms or EKG changes.  He did not feel this was clinically significant and did not feel further cardiac w/up warranted.  Currently doing well.  Follow.    INCREASED PSYCHOSOCIAL STRESSORS.  Appears to be stable.  Still dealing with the increased stress of her husbands medical issues.    HEALTH MAINTENANCE.  GYN exam - through [facility].  She declined breast exam.  Should be scheduled for a mammogram.  Reports up to date with gyn evaluation.

## 2012-11-28 NOTE — Assessment & Plan Note (Signed)
Stable.  Has had extensive w/up.  Follow.  

## 2012-11-30 ENCOUNTER — Encounter: Payer: Self-pay | Admitting: Internal Medicine

## 2012-11-30 ENCOUNTER — Telehealth: Payer: Self-pay | Admitting: Internal Medicine

## 2012-11-30 NOTE — Telephone Encounter (Signed)
See below phone note   Melinda [name],      Saw Dr. Lorin Picket on Monday. She referred me to dermatologist, Dr. Cheree Ditto. Thought that use of Aquaphor ointment might be helpful with rash. Started using ointment yesterday. No profound improvement or change, but may take time. Still have hot skin over arms, neck, chest, and face. Rash is still present, not diminished. Please forward these contents to Dr. Lorin Picket, Dr. Cheree Ditto. Thanks!      ----- Message -----   From: Katheren Puller   Sent: 11/27/2012 7:47 AM EDT   To: Elease Hashimoto Dolce   Subject: RE: Appointment Request      MS Flippin   I can get you in with Dr Lorin Picket Today Monday 11/27/12 @ 4 Or I can get you in today Monay 5/5 @ 11:15 with Raquel Rey Please advise which appointment you would like   Thank you   Robin      ----- Message -----   From: Pawlicki,Keary   Sent: 11/25/2012 9:23 AM EDT   To: Patient Appointment Schedule Request Mailing List   Subject: Appointment Request      Appointment Request From: Jalene Mullet      With Provider: Charm Barges, MD [-Primary Care Physician-]      Preferred Date Range: From 11/25/2012 To 11/27/2012      Preferred Times: Monday Morning, Monday Afternoon      Reason for visit: Acute Office Visit      Comments:      Sorry for alarm, but nurse said to call Monday. Rash spreading now to shoulders, face, neck. Hydrocortisone no longer helping. Cool compresses, some, but no diagnosis or skin scraping test yet, if at all possible. Can u assist or direct? Clothing on affected areas increases heat in skin, like steroid in Cereva did. Know sounds bizarre. Help, please?

## 2012-12-01 NOTE — Telephone Encounter (Signed)
Spoke with Melinda [name] & advised her to continue the Zyrtec & Aquaphor (take Zyrtec in the evening since it is making her very drowsy), and get back in to see Dr. Cheree Ditto

## 2012-12-01 NOTE — Telephone Encounter (Signed)
I referred her to Dr Cheree Ditto.  Continue the zyrtec and aquaphor.  I am not sure by this message if she needs anything else.  I think she needs to see dermatology.  Referral already made.

## 2012-12-04 ENCOUNTER — Ambulatory Visit: Admitting: Internal Medicine

## 2012-12-22 ENCOUNTER — Telehealth: Payer: Self-pay | Admitting: Internal Medicine

## 2012-12-22 NOTE — Telephone Encounter (Signed)
FYI  Pt called to let you know that skin biospy @ Dr Cheree Ditto, Dermology in [facility]   12/29/12 @ 10:15 for her rash.  Pt stated she has had this  rash for 1 1/2 month,.she stated all the area still have rash on Back. Neck, chest arms some on legs

## 2012-12-22 NOTE — Telephone Encounter (Signed)
noted 

## 2013-01-30 ENCOUNTER — Other Ambulatory Visit (INDEPENDENT_AMBULATORY_CARE_PROVIDER_SITE_OTHER)

## 2013-01-30 ENCOUNTER — Encounter: Payer: Self-pay | Admitting: Internal Medicine

## 2013-01-30 DIAGNOSIS — D696 Thrombocytopenia, unspecified: Secondary | ICD-10-CM

## 2013-01-30 LAB — CBC WITH DIFFERENTIAL/PLATELET
Basophils Absolute: 0 10*3/uL (ref 0.0–0.1)
Eosinophils Absolute: 0 10*3/uL (ref 0.0–0.7)
Lymphocytes Relative: 44.5 % (ref 12.0–46.0)
MCHC: 32.7 g/dL (ref 30.0–36.0)
Neutrophils Relative %: 44 % (ref 43.0–77.0)
RBC: 4.23 Mil/uL (ref 3.87–5.11)
RDW: 13.9 % (ref 11.5–14.6)

## 2013-02-01 ENCOUNTER — Encounter: Payer: Self-pay | Admitting: Internal Medicine

## 2013-02-01 ENCOUNTER — Ambulatory Visit (INDEPENDENT_AMBULATORY_CARE_PROVIDER_SITE_OTHER): Admitting: Internal Medicine

## 2013-02-01 VITALS — BP 120/74 | HR 74 | Temp 98.8°F | Ht 66.0 in | Wt 169.5 lb

## 2013-02-01 DIAGNOSIS — R42 Dizziness and giddiness: Secondary | ICD-10-CM

## 2013-02-01 DIAGNOSIS — R21 Rash and other nonspecific skin eruption: Secondary | ICD-10-CM

## 2013-02-01 DIAGNOSIS — E7800 Pure hypercholesterolemia, unspecified: Secondary | ICD-10-CM

## 2013-02-01 MED ORDER — SERTRALINE HCL 50 MG PO TABS: 50.0000 mg | ORAL_TABLET | Freq: Every day | ORAL | Status: DC

## 2013-02-02 ENCOUNTER — Telehealth: Payer: Self-pay | Admitting: *Deleted

## 2013-02-02 ENCOUNTER — Telehealth: Payer: Self-pay | Admitting: Internal Medicine

## 2013-02-02 DIAGNOSIS — F329 Major depressive disorder, single episode, unspecified: Secondary | ICD-10-CM

## 2013-02-02 NOTE — Telephone Encounter (Signed)
Pt informed to try meds a little longer if she can tolerate it & report any sx's on Monday.

## 2013-02-02 NOTE — Telephone Encounter (Signed)
Please see message from Lupita Leash

## 2013-02-02 NOTE — Telephone Encounter (Signed)
Noted.  I am going to refer her to psych.  Referral placed.

## 2013-02-02 NOTE — Telephone Encounter (Signed)
Started sertraline 1/2 tab and is feeling very jittery, agitated, and feels like her skin is crawling.  Pt states she cannot come in for an appt as she does not feel that she can drive and does not have a ride.  Please contact pt to let her know what to do, she is very concerned/scared.

## 2013-02-02 NOTE — Telephone Encounter (Signed)
Pt called back to report that she is experiencing more side effects of the Zoloft: Hot & cold, feels electric currents going through body, dry mouth, & feels jittery. Doesn't feel comfortable taking this medication. I have advised pt not to take any additional doses unless instructed otherwise.

## 2013-02-02 NOTE — Telephone Encounter (Signed)
Took a half tab of Zoloft this morning. C/O difficulty focusing (vision), drowsiness, & anxiety/aggetation. Doesn't like the way it makes her feel. Please advise rather she should d/c or continue taking medication.

## 2013-02-02 NOTE — Telephone Encounter (Signed)
Please confirm she is doing better now.  I discussed with her yesterday that it may make her feel different at first.  If she is comfortable, I would like for her to try again taking the medication.  I still want to refer her to psych.  If problems, will stop the medication.

## 2013-02-02 NOTE — Telephone Encounter (Signed)
See other message duplicate

## 2013-02-04 ENCOUNTER — Encounter: Payer: Self-pay | Admitting: Internal Medicine

## 2013-02-04 NOTE — Assessment & Plan Note (Signed)
Seeing dermatology.  Instructed her to continue to f/u with dermatology.  Has tried multiple treatment modalities.  Doing better currently.

## 2013-02-04 NOTE — Assessment & Plan Note (Signed)
Low cholesterol diet and exercise.  Follow lipid panel.   

## 2013-02-04 NOTE — Progress Notes (Signed)
Subjective:    Patient ID: Melinda [name], female    DOB: 08/02/1955, 57 y.o.   MRN: [id number]  HPI 57 year old female with past history of hypercholesterolemia and vertigo (s/p extensive w/up - followed by neurology) who comes in today for a scheduled follow up.   She has had persistent problems with rash.  Has seen dermatology and has tried multiple treatment modalities.  Seeing Dr Cheree Ditto.  States was told she had Lichen Planus.  Rash is better.  She states it is aggravating - continuing to deal with the rash.  She also is under an increased amount of stress.  Feels she needs something to help her deal with the increased stress.  No suicidal ideations.  Is agreeable to see psych.  Eating and drinkig.  Has had some weight loss.  No bowel change.  Urinary issues - stable.  Also stress with having to deal with the persistent dizziness.  Has had extensive w/up.       Past Medical History  Diagnosis Date  . Arthritis   . Vertigo   . Hypercholesterolemia   . Sleep apnea     Current Outpatient Prescriptions on File Prior to Visit  Medication Sig Dispense Refill  . Calcium Carb-Cholecalciferol (CALCIUM-VITAMIN D) 600-400 MG-UNIT TABS Take 1 tablet by mouth daily.      . Cranberry 1000 MG CAPS Take 500 mg by mouth daily.       . Cyanocobalamin (VITAMIN B 12 PO) Take 1,000 mg by mouth daily.      . meclizine (ANTIVERT) 25 MG tablet Take 12.5 mg by mouth 2 (two) times daily as needed.       . Multiple Vitamin (MULTIVITAMIN) tablet Take 1 tablet by mouth daily.        No current facility-administered medications on file prior to visit.    Review of Systems Patient denies any headache.  Dizziness/light headedness - unchanged.  Has been worked up extensively.  Saw ENT and Neurology.   No palpitations.  Denies any chest pain.  No increased shortness of breath, cough or congestion.  No nausea or vomiting.  No abdominal pain or cramping.  No BRBPR or melana.  Feels her bowels are back to normal  now.  No urine change now.  Urinating without difficulty.  Increased stress as outlined.  No suicidal ideations.       Objective:   Physical Exam  Filed Vitals:   02/01/13 1035  BP: 120/74  Pulse: 74  Temp: 98.8 F (52.20 C)   57 year old female in no acute distress.   HEENT:  Nares- clear.  Oropharynx - without lesions. NECK:  Supple.  Nontender.  No audible bruit.  HEART:  Appears to be regular. LUNGS:  No crackles or wheezing audible.  Respirations even and unlabored.  RADIAL PULSE:  Equal bilaterally.  ABDOMEN:  Soft, nontender.  Bowel sounds present and normal.  No audible abdominal bruit.   EXTREMITIES:  No increased edema present.  DP pulses palpable and equal bilaterally.       Assessment & Plan:  GYN.  S/p surgery.  States everything checked out fine.  Benign.  Has been released.  Currently doing well.    WEIGHT LOSS.  Weight has fluctuated recently.  Follow.  Bowels back to normal.   GU.  Denies any problems with urination.  Cancelled her appt with Dr Achilles Dunk.  Follow.    CARDIOVASCULAR.  Just saw Dr Gwen Pounds.  Per his note, the stress test revealed  normal LV function at peak stress.  There were changes in the inferior intraseptal area.  These changes were not associated with symptoms or EKG changes.  He did not feel this was clinically significant and did not feel further cardiac w/up warranted.  Currently doing well.  Follow.    INCREASED PSYCHOSOCIAL STRESSORS.  Increased stress as outlined.  Discussed at length with her today.  Agreed to psych referral.  Will start zoloft 50mg  (1/2 tablet) q day x one week and then increase to one whole tablet per day.  Follow. Closely.  Get her back in soon to reassess.     HEALTH MAINTENANCE.  Physical 10/27/12.   [facility] exam - through [facility]  She declined breast exam.  Mammogram 11/03/12 - Birads I.   Reports up to date with gyn evaluation.

## 2013-02-04 NOTE — Assessment & Plan Note (Signed)
Stable.  Has had extensive w/up.  Follow.  Encouraged her to f/u with Dr Juengel.  Try to decreased the amount of meclizine taking.        

## 2013-02-05 ENCOUNTER — Telehealth: Payer: Self-pay | Admitting: Internal Medicine

## 2013-02-05 NOTE — Telephone Encounter (Signed)
Pt says she is doing a lot better since she stopped the Zoloft and would like to continue with the Counseling referral.

## 2013-02-05 NOTE — Telephone Encounter (Signed)
Noted.  I have already placed order for referral.

## 2013-02-19 ENCOUNTER — Telehealth: Payer: Self-pay | Admitting: Internal Medicine

## 2013-02-19 NOTE — Telephone Encounter (Signed)
Pt says vertigo is still really bad.  Pt asking if she can get a handicap sticker for car so she does not have to walk so far to entrances.  States she has no trouble driving, just walking.

## 2013-02-20 NOTE — Telephone Encounter (Signed)
Pt notified & form mailed to pt

## 2013-02-20 NOTE — Telephone Encounter (Signed)
Handicap form completed. 

## 2013-03-13 ENCOUNTER — Telehealth: Payer: Self-pay | Admitting: Internal Medicine

## 2013-03-13 NOTE — Telephone Encounter (Signed)
I can see her at 4:00 on 03/19/13 - block .  Is she having any acute problems.  Is so, let me know - will need to be scheduled at another time.

## 2013-03-13 NOTE — Telephone Encounter (Signed)
The patient is needing an appointment in August.

## 2013-03-14 NOTE — Telephone Encounter (Signed)
Pt notified. appt scheduled.

## 2013-03-19 ENCOUNTER — Ambulatory Visit (INDEPENDENT_AMBULATORY_CARE_PROVIDER_SITE_OTHER): Admitting: Internal Medicine

## 2013-03-19 ENCOUNTER — Encounter: Payer: Self-pay | Admitting: Internal Medicine

## 2013-03-19 VITALS — BP 118/80 | HR 75 | Temp 98.6°F | Ht 66.0 in | Wt 172.5 lb

## 2013-03-19 DIAGNOSIS — M129 Arthropathy, unspecified: Secondary | ICD-10-CM

## 2013-03-19 DIAGNOSIS — R42 Dizziness and giddiness: Secondary | ICD-10-CM

## 2013-03-19 DIAGNOSIS — G4730 Sleep apnea, unspecified: Secondary | ICD-10-CM

## 2013-03-19 DIAGNOSIS — M1990 Unspecified osteoarthritis, unspecified site: Secondary | ICD-10-CM

## 2013-03-19 DIAGNOSIS — R21 Rash and other nonspecific skin eruption: Secondary | ICD-10-CM

## 2013-03-19 DIAGNOSIS — E7800 Pure hypercholesterolemia, unspecified: Secondary | ICD-10-CM

## 2013-03-23 ENCOUNTER — Ambulatory Visit: Payer: Self-pay | Admitting: Family Medicine

## 2013-03-23 ENCOUNTER — Telehealth: Payer: Self-pay | Admitting: Internal Medicine

## 2013-03-23 NOTE — Telephone Encounter (Signed)
Pt continues to have swelling in right leg exterior of calf.  States she has been using the cortisone 10 mixed with water religiously as instructed.  States the swelling is not painful.  Pt states she was told to let Dr. Lorin Picket know if this was still going on.  Asking what to do now.

## 2013-03-23 NOTE — Telephone Encounter (Signed)
Pt notified & will evaluate over the weekend & call back on Tuesday if no better. Pt also aware to go to Acute Care of ER if sx's worsen

## 2013-03-23 NOTE — Telephone Encounter (Signed)
If increased swelling, needs reeval today - acute care.  If persistent problems, I can reevaluate her.  Thanks.

## 2013-03-24 ENCOUNTER — Encounter: Payer: Self-pay | Admitting: Internal Medicine

## 2013-03-24 NOTE — Assessment & Plan Note (Signed)
Relatively stable. 

## 2013-03-24 NOTE — Assessment & Plan Note (Signed)
Stable.  Has had extensive w/up.  Follow.  Encouraged her to f/u with Dr Elenore Rota.  Try to decreased the amount of meclizine taking.

## 2013-03-24 NOTE — Progress Notes (Signed)
Subjective:    Patient ID: Melinda [name], female    DOB: Jan 18, 1956, 57 y.o.   MRN: [id number]  HPI 57 year old female with past history of hypercholesterolemia and vertigo (s/p extensive w/up - followed by neurology) who comes in today for a scheduled follow up.   She has had persistent problems with rash.  Has seen dermatology and has tried multiple treatment modalities.  Seeing Dr Cheree Ditto.  States was told she had Lichen Planus. Has used various creams and topical treatments.  Continues to follow up with dermatology.    Eating and drinking.  No bowel change.  Urinary issues - stable.  Also stress with having to deal with the persistent dizziness.  Has had extensive w/up.  Feels from a stress standpoint - she is doing better.  Does not feel she needs to see a psychiatrist now.  We did discuss counseling.  States she might be agreeable to this.       Past Medical History  Diagnosis Date  . Arthritis   . Vertigo   . Hypercholesterolemia   . Sleep apnea     Current Outpatient Prescriptions on File Prior to Visit  Medication Sig Dispense Refill  . Calcium Carb-Cholecalciferol (CALCIUM-VITAMIN D) 600-400 MG-UNIT TABS Take 1 tablet by mouth daily.      . Cyanocobalamin (VITAMIN B 12 PO) Take 1,000 mg by mouth daily.      . meclizine (ANTIVERT) 25 MG tablet Take 12.5 mg by mouth 2 (two) times daily as needed.       . Multiple Vitamin (MULTIVITAMIN) tablet Take 1 tablet by mouth daily.        No current facility-administered medications on file prior to visit.    Review of Systems Patient denies any headache.  Dizziness/light headedness - unchanged.  Has been worked up extensively.  Saw ENT and Neurology.   No palpitations.  Denies any chest pain.  No increased shortness of breath, cough or congestion.  No nausea or vomiting.  No abdominal pain or cramping.  No BRBPR or melana.  Bowels stable.  No urine change now.  Urinating without difficulty.  Increased stress as outlined in previous  notes.  Feels she is coping better and doing better.  See above.  Persistent problems with he rash.      Objective:   Physical Exam  Filed Vitals:   03/19/13 1614  BP: 118/80  Pulse: 75  Temp: 98.6 F (71 C)   57 year old female in no acute distress.   HEENT:  Nares- clear.  Oropharynx - without lesions. NECK:  Supple.  Nontender.  No audible bruit.  HEART:  Appears to be regular. LUNGS:  No crackles or wheezing audible.  Respirations even and unlabored.  RADIAL PULSE:  Equal bilaterally.  ABDOMEN:  Soft, nontender.  Bowel sounds present and normal.  No audible abdominal bruit.   EXTREMITIES:  DP pulses palpable and equal bilaterally.  Circular lesion right lower leg.  No increased erythema.       Assessment & Plan:  GYN.  S/p surgery.  States everything checked out fine.  Benign.  Has been released.  Currently doing well.    WEIGHT LOSS.  Weight has fluctuated recently.  Follow.  Bowels back to normal.   GU.  Denies any problems with urination.  Cancelled her appt with Dr Achilles Dunk.  Follow.    CARDIOVASCULAR.  Just saw Dr Gwen Pounds.  Per his note, the stress test revealed normal LV function at peak stress.  There were changes in the inferior intraseptal area.  These changes were not associated with symptoms or EKG changes.  He did not feel this was clinically significant and did not feel further cardiac w/up warranted.  Currently doing well.  Follow.    INCREASED PSYCHOSOCIAL STRESSORS.  Increased stress as outlined in previous notes.  Better now.  Desires not to follow up with psychiatry at this time.  May agree to counseling.  Information given.    HEALTH MAINTENANCE.  Physical 10/27/12.   [facility] exam - through [facility].  She declined breast exam.  Mammogram 11/03/12 - Birads I.   Reports up to date with gyn evaluation.

## 2013-03-24 NOTE — Assessment & Plan Note (Signed)
Low cholesterol diet and exercise.  Follow lipid panel.   

## 2013-03-24 NOTE — Assessment & Plan Note (Signed)
Continues on CPAP.   Follow.

## 2013-03-24 NOTE — Assessment & Plan Note (Signed)
Seeing dermatology.  Instructed her to continue to f/u with dermatology.  Has tried multiple treatment modalities.  Was doing better.  Has a circular rash lower leg.  TCC topically.  Call if persistent.

## 2013-03-27 ENCOUNTER — Encounter: Payer: Self-pay | Admitting: Internal Medicine

## 2013-03-27 NOTE — Telephone Encounter (Signed)
Pt states she was seen at Urgent care on Friday evening and was prescribed a cream mupirocin for her leg.  She states she has had side effects of dizziness, double vision and change of bowel movements she thinks due to the cream.  Pt has discontinued the cream.  Still has minimal swelling and redness.  Asking for a call for next step.

## 2013-04-04 ENCOUNTER — Encounter: Payer: Self-pay | Admitting: Internal Medicine

## 2013-04-04 ENCOUNTER — Encounter: Payer: Self-pay | Admitting: *Deleted

## 2013-04-04 ENCOUNTER — Telehealth: Payer: Self-pay | Admitting: Internal Medicine

## 2013-04-04 NOTE — Telephone Encounter (Signed)
Sent mychart message

## 2013-04-04 NOTE — Telephone Encounter (Signed)
Pneumonia shot and flu shot.  Also, tetanus if not up to date.  Would hold on shingles, until we discuss it more.  Can discuss at her next visit.

## 2013-04-04 NOTE — Telephone Encounter (Signed)
Pt would like to know what all immunizations are necessary for a 57 year old woman.  She states she did get her flu shot last year and she was advised we do have those available.  Also asking if shingles is communicable.

## 2013-04-10 ENCOUNTER — Telehealth: Payer: Self-pay | Admitting: Internal Medicine

## 2013-04-10 NOTE — Telephone Encounter (Signed)
Counselor name and phone number given.

## 2013-04-12 ENCOUNTER — Encounter: Payer: Self-pay | Admitting: Internal Medicine

## 2013-04-12 NOTE — Telephone Encounter (Signed)
Mailed unread message to pt  

## 2013-04-16 ENCOUNTER — Encounter: Payer: Self-pay | Admitting: Internal Medicine

## 2013-04-16 DIAGNOSIS — K2970 Gastritis, unspecified, without bleeding: Secondary | ICD-10-CM

## 2013-08-17 ENCOUNTER — Telehealth: Payer: Self-pay | Admitting: *Deleted

## 2013-08-17 ENCOUNTER — Encounter: Payer: Self-pay | Admitting: *Deleted

## 2013-08-17 NOTE — Telephone Encounter (Signed)
Pt called to let you know that she received a letter stating that she is due for a renewal of her CPAP for sleep apnea. She states that you have always done this for her & wanted to speak with you. She was last seen in August. I am going to send her a mychart message to see if I can get more information also.

## 2013-08-17 NOTE — Telephone Encounter (Signed)
Yes, need more information.  I am not sure what she is talking about.  If her sleep med company needs something, tell her to have them send the information to [place] (as to what is needed).  Thanks.

## 2013-08-21 NOTE — Telephone Encounter (Signed)
Mailed unread message to pt  

## 2013-08-23 ENCOUNTER — Ambulatory Visit (INDEPENDENT_AMBULATORY_CARE_PROVIDER_SITE_OTHER): Admitting: Adult Health

## 2013-08-23 ENCOUNTER — Encounter: Payer: Self-pay | Admitting: Adult Health

## 2013-08-23 VITALS — BP 122/60 | HR 77 | Resp 12 | Wt 160.0 lb

## 2013-08-23 DIAGNOSIS — R143 Flatulence: Secondary | ICD-10-CM

## 2013-08-23 DIAGNOSIS — R142 Eructation: Secondary | ICD-10-CM | POA: Insufficient documentation

## 2013-08-23 DIAGNOSIS — R141 Gas pain: Secondary | ICD-10-CM

## 2013-08-23 DIAGNOSIS — G4730 Sleep apnea, unspecified: Secondary | ICD-10-CM

## 2013-08-23 NOTE — Progress Notes (Signed)
Pre visit review using our clinic review tool, if applicable. No additional management support is needed unless otherwise documented below in the visit note. 

## 2013-08-23 NOTE — Assessment & Plan Note (Signed)
Using her CPAP machine nightly and also during naps. Tolerating well. Brought in paperwork from sleep med therapy stating that they were going to service her equipment. Continue to follow.

## 2013-08-23 NOTE — Patient Instructions (Signed)
  For your GI symptoms, try an over the counter medication such as Omeprazole or Nexium daily for 14 days. Take on an empty stomach, 30 min before breakfast.  Follow up appt in 1 month with Dr. Nicki Reaper.

## 2013-08-23 NOTE — Progress Notes (Signed)
   Subjective:    Patient ID: Melinda [name], female    DOB: 03/02/1956, 58 y.o.   MRN: [id number]  HPI  Pt is a pleasant 58 y/o female who presents to clinic for followup sleep apnea and GI concerns. She brings with her a letter that she received from sleep med therapy regarding servicing her equipment. She brought her paperwork with her to make sure everything is ok. She is using her CPAP machine every night and also when she naps during the day.  She also has some GI upset. Reports having lots of belching and pressure in her epigastric area. She states that these symptoms occur often. She has tried Phazyme but did not think this helped. No n/v. No diarrhea or constipation.  Current Outpatient Prescriptions on File Prior to Visit  Medication Sig Dispense Refill  . Calcium Carb-Cholecalciferol (CALCIUM-VITAMIN D) 600-400 MG-UNIT TABS Take 1 tablet by mouth daily.      . Magnesium 250 MG TABS Take by mouth.      . meclizine (ANTIVERT) 25 MG tablet Take 12.5 mg by mouth 2 (two) times daily.       . Multiple Vitamin (MULTIVITAMIN) tablet Take 1 tablet by mouth daily.        No current facility-administered medications on file prior to visit.     Review of Systems  Constitutional: Negative.   Respiratory: Negative.        Using her CPAP nightly and while napping  Cardiovascular: Negative.   Gastrointestinal:       Burping, pressure in epigastric area  Genitourinary: Negative.   Psychiatric/Behavioral: Negative.   All other systems reviewed and are negative.       Objective:   Physical Exam  Constitutional: She is oriented to person, place, and time. No distress.  HENT:  Head: Normocephalic and atraumatic.  Cardiovascular: Normal rate and regular rhythm.   Pulmonary/Chest: Effort normal. No respiratory distress.  Abdominal: Soft. Bowel sounds are normal. She exhibits no distension. There is tenderness.  Epigastric tenderness with palpation  Neurological: She is alert and  oriented to person, place, and time.  Psychiatric: She has a normal mood and affect. Her behavior is normal. Judgment and thought content normal.          Assessment & Plan:

## 2013-08-23 NOTE — Assessment & Plan Note (Signed)
She has tried Phazyme in the past without any results. Recommend she try over-the-counter PPIs such as omeprazole or Nexium. It take 1 daily first thing in the morning preferably 30 minutes before breakfast. Recommended she take for 14 days to see if she notices any improvement. Recommended followup with PCP in one month.

## 2013-09-13 NOTE — Telephone Encounter (Signed)
Late entry: pt sent mychart message letting us know that she did not need anything. The letter was basically for her information listing her supplies.

## 2013-09-27 ENCOUNTER — Encounter: Payer: Self-pay | Admitting: Internal Medicine

## 2013-09-27 ENCOUNTER — Ambulatory Visit (INDEPENDENT_AMBULATORY_CARE_PROVIDER_SITE_OTHER): Admitting: Internal Medicine

## 2013-09-27 VITALS — BP 120/90 | HR 83 | Temp 98.5°F | Ht 66.0 in | Wt 185.5 lb

## 2013-09-27 DIAGNOSIS — R143 Flatulence: Secondary | ICD-10-CM

## 2013-09-27 DIAGNOSIS — G4730 Sleep apnea, unspecified: Secondary | ICD-10-CM

## 2013-09-27 DIAGNOSIS — R42 Dizziness and giddiness: Secondary | ICD-10-CM

## 2013-09-27 DIAGNOSIS — R141 Gas pain: Secondary | ICD-10-CM

## 2013-09-27 DIAGNOSIS — R142 Eructation: Secondary | ICD-10-CM

## 2013-09-27 DIAGNOSIS — E7800 Pure hypercholesterolemia, unspecified: Secondary | ICD-10-CM

## 2013-09-27 NOTE — Patient Instructions (Signed)
Zantac (ranitidine) 150mg  - one tablet 30 minutes before breakfast.

## 2013-09-27 NOTE — Progress Notes (Signed)
Pre-visit discussion using our clinic review tool. No additional management support is needed unless otherwise documented below in the visit note.  

## 2013-09-30 ENCOUNTER — Encounter: Payer: Self-pay | Admitting: Internal Medicine

## 2013-09-30 NOTE — Assessment & Plan Note (Signed)
Low cholesterol diet and exercise.  Follow lipid panel.   

## 2013-09-30 NOTE — Assessment & Plan Note (Signed)
Increased gas.  Worse with spaghetti and acidic foods.  Intolerance to nexium and omeprazole.  Start zantac 150mg  q day.  Keep appt with GI for further evaluation and treatment.

## 2013-09-30 NOTE — Progress Notes (Signed)
Subjective:    Patient ID: Melinda [name], female    DOB: 04-21-1956, 58 y.o.   MRN: [id number]  HPI 58 year old female with past history of hypercholesterolemia and vertigo (s/p extensive w/up - followed by neurology) who comes in today for a scheduled follow up.  She reports that starting one month ago she noticed some increased gas.  Was evaluated and placed on omeprazole.  Had diarrhea with this.  Changed to nexium.  This caused nausea and dizziness.  She describes some increased belching.  No acid reflux.  Symptoms are worse with spaghetti and acidic foods.  Bowels are regular.  She is eating and drinking.   No nausea or vomiting.  On a stool softener and taking a probiotic.  Urinary issues - stable.  Due to see Ebony Cargo (GI) on 10/01/13.       Past Medical History  Diagnosis Date  . Arthritis   . Vertigo   . Hypercholesterolemia   . Sleep apnea     Current Outpatient Prescriptions on File Prior to Visit  Medication Sig Dispense Refill  . Calcium Carb-Cholecalciferol (CALCIUM-VITAMIN D) 600-400 MG-UNIT TABS Take 1 tablet by mouth daily.      Marland Kitchen co-enzyme Q-10 30 MG capsule Take 100 mg by mouth 2 (two) times daily.       . Cranberry 450 MG CAPS Take 1 capsule by mouth daily.      . Magnesium 250 MG TABS Take 125 mg by mouth.       . meclizine (ANTIVERT) 25 MG tablet Take 12.5 mg by mouth 2 (two) times daily.       . Multiple Vitamin (MULTIVITAMIN) tablet Take 1 tablet by mouth daily.        No current facility-administered medications on file prior to visit.    Review of Systems Patient denies any headache.  Dizziness/light headedness - unchanged.  Has been worked up extensively.  Saw ENT and Neurology.   No palpitations.  Denies any chest pain.  No increased shortness of breath, cough or congestion.  No nausea or vomiting.  No abdominal pain or cramping.  Increased gas as outlined.  No BRBPR or melana.  Bowels stable.  No urine change now.  Urinating without difficulty.      Objective:   Physical Exam  Filed Vitals:   09/27/13 1517  BP: 120/90  Pulse: 83  Temp: 98.5 F (77.39 C)   58 year old female in no acute distress.   HEENT:  Nares- clear.  Oropharynx - without lesions. NECK:  Supple.  Nontender.  No audible bruit.  HEART:  Appears to be regular. LUNGS:  No crackles or wheezing audible.  Respirations even and unlabored.  RADIAL PULSE:  Equal bilaterally.  ABDOMEN:  Soft, nontender.  Bowel sounds present and normal.  No audible abdominal bruit.   EXTREMITIES:  DP pulses palpable and equal bilaterally.  Circular lesion right lower leg.  No increased erythema.       Assessment & Plan:  GYN.  S/p surgery.  States everything checked out fine.  Benign.  Has been released.  Currently doing well.    GU.  Denies any problems with urination.  Cancelled her appt with Dr Jacqlyn Larsen.  Follow.    CARDIOVASCULAR.  Just saw Dr Nehemiah Massed.  Per his note, the stress test revealed normal LV function at peak stress.  There were changes in the inferior intraseptal area.  These changes were not associated with symptoms or EKG changes.  He did  not feel this was clinically significant and did not feel further cardiac w/up warranted.  Currently doing well.  Follow.    INCREASED PSYCHOSOCIAL STRESSORS.  Increased stress as outlined in previous notes.  Better now.    HEALTH MAINTENANCE.  Physical 10/27/12.   GYN exam - through [facility].  She declined breast exam.  Mammogram 11/03/12 - Birads I.   Reports up to date with gyn evaluation.

## 2013-09-30 NOTE — Assessment & Plan Note (Signed)
Using her CPAP machine nightly and also during naps. Tolerating well.   

## 2013-09-30 NOTE — Assessment & Plan Note (Signed)
Stable.  Has had extensive w/up.  Follow.  

## 2013-10-04 ENCOUNTER — Other Ambulatory Visit (INDEPENDENT_AMBULATORY_CARE_PROVIDER_SITE_OTHER)

## 2013-10-04 DIAGNOSIS — G4730 Sleep apnea, unspecified: Secondary | ICD-10-CM

## 2013-10-04 DIAGNOSIS — E7800 Pure hypercholesterolemia, unspecified: Secondary | ICD-10-CM

## 2013-10-04 LAB — COMPREHENSIVE METABOLIC PANEL
ALBUMIN: 4.1 g/dL (ref 3.5–5.2)
ALK PHOS: 49 U/L (ref 39–117)
ALT: 21 U/L (ref 0–35)
AST: 23 U/L (ref 0–37)
BUN: 13 mg/dL (ref 6–23)
CALCIUM: 9.4 mg/dL (ref 8.4–10.5)
CHLORIDE: 103 meq/L (ref 96–112)
CO2: 28 mEq/L (ref 19–32)
Creatinine, Ser: 0.6 mg/dL (ref 0.4–1.2)
GFR: 108.62 mL/min (ref >60.00)
Glucose, Bld: 98 mg/dL (ref 70–99)
POTASSIUM: 4.2 meq/L (ref 3.5–5.1)
SODIUM: 139 meq/L (ref 135–145)
Total Bilirubin: 0.6 mg/dL (ref 0.3–1.2)
Total Protein: 7.3 g/dL (ref 6.0–8.3)

## 2013-10-04 LAB — CBC WITH DIFFERENTIAL/PLATELET
BASOS PCT: 0.7 % (ref 0.0–3.0)
Basophils Absolute: 0 10*3/uL (ref 0.0–0.1)
EOS ABS: 0.2 10*3/uL (ref 0.0–0.7)
Eosinophils Relative: 2.2 % (ref 0.0–5.0)
HCT: 38.2 % (ref 36.0–46.0)
Hemoglobin: 12.6 g/dL (ref 12.0–15.0)
LYMPHS PCT: 39.1 % (ref 12.0–46.0)
Lymphs Abs: 2.9 10*3/uL (ref 0.7–4.0)
MCHC: 32.8 g/dL (ref 30.0–36.0)
MCV: 92.1 fl (ref 78.0–100.0)
Monocytes Absolute: 0.7 10*3/uL (ref 0.1–1.0)
Monocytes Relative: 9.1 % (ref 3.0–12.0)
NEUTROS PCT: 48.9 % (ref 43.0–77.0)
Neutro Abs: 3.7 10*3/uL (ref 1.4–7.7)
PLATELETS: 446 10*3/uL — AB (ref 150.0–400.0)
RBC: 4.15 Mil/uL (ref 3.87–5.11)
RDW: 14 % (ref 11.5–14.6)
WBC: 7.5 10*3/uL (ref 4.5–10.5)

## 2013-10-04 LAB — LIPID PANEL
CHOLESTEROL: 188 mg/dL (ref 0–200)
HDL: 40.4 mg/dL (ref >39.00)
LDL CALC: 118 mg/dL — AB (ref 0–99)
Total CHOL/HDL Ratio: 5
Triglycerides: 147 mg/dL (ref 0.0–149.0)
VLDL: 29.4 mg/dL (ref 0.0–40.0)

## 2013-10-04 LAB — TSH: TSH: 0.91 u[IU]/mL (ref 0.35–5.50)

## 2013-10-05 ENCOUNTER — Encounter: Payer: Self-pay | Admitting: Internal Medicine

## 2013-10-18 ENCOUNTER — Encounter: Payer: Self-pay | Admitting: Internal Medicine

## 2013-11-13 IMAGING — CR DG CHEST 2V
1 series · 3 of 3 positions shown · non-contrast
Comparison: none

REASON FOR EXAM: syncope, chest pain
COMMENTS:

[Series 1: ap · 0.17mm/px · 3 of 3 slices shown]
[im 1/3]
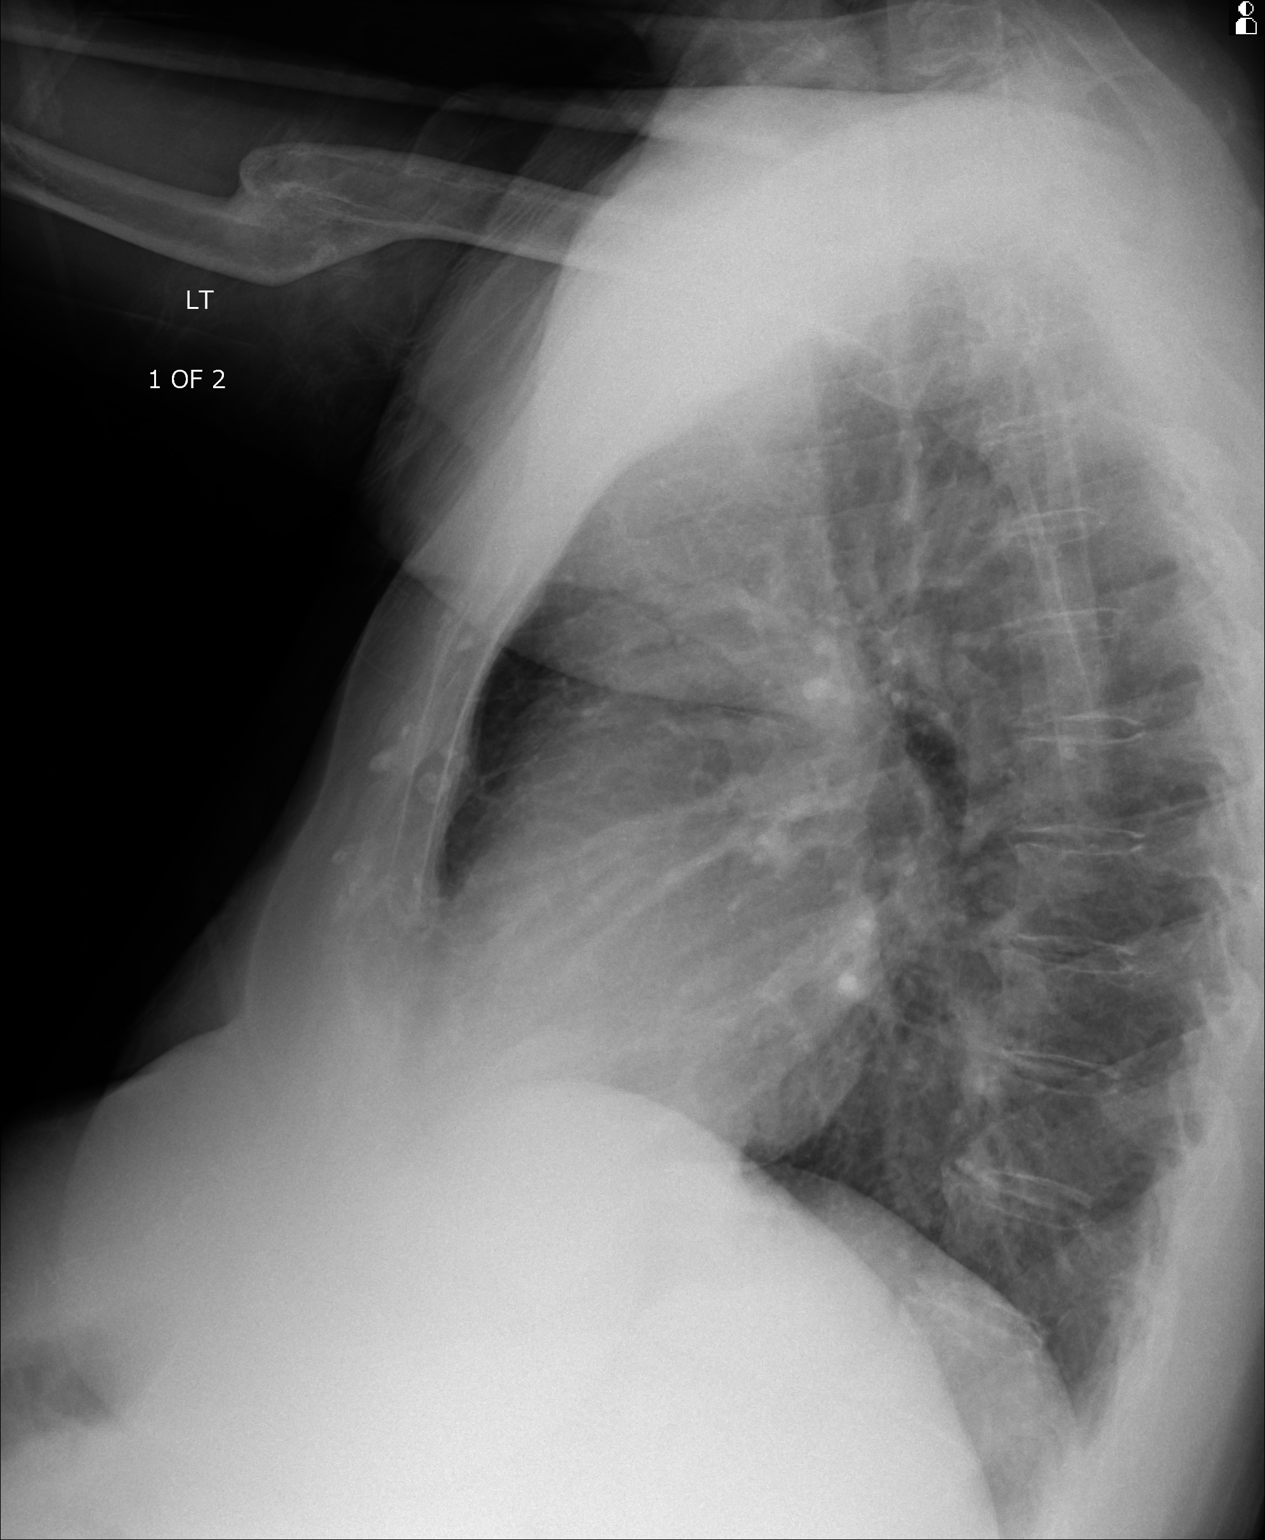
[im 2/3]
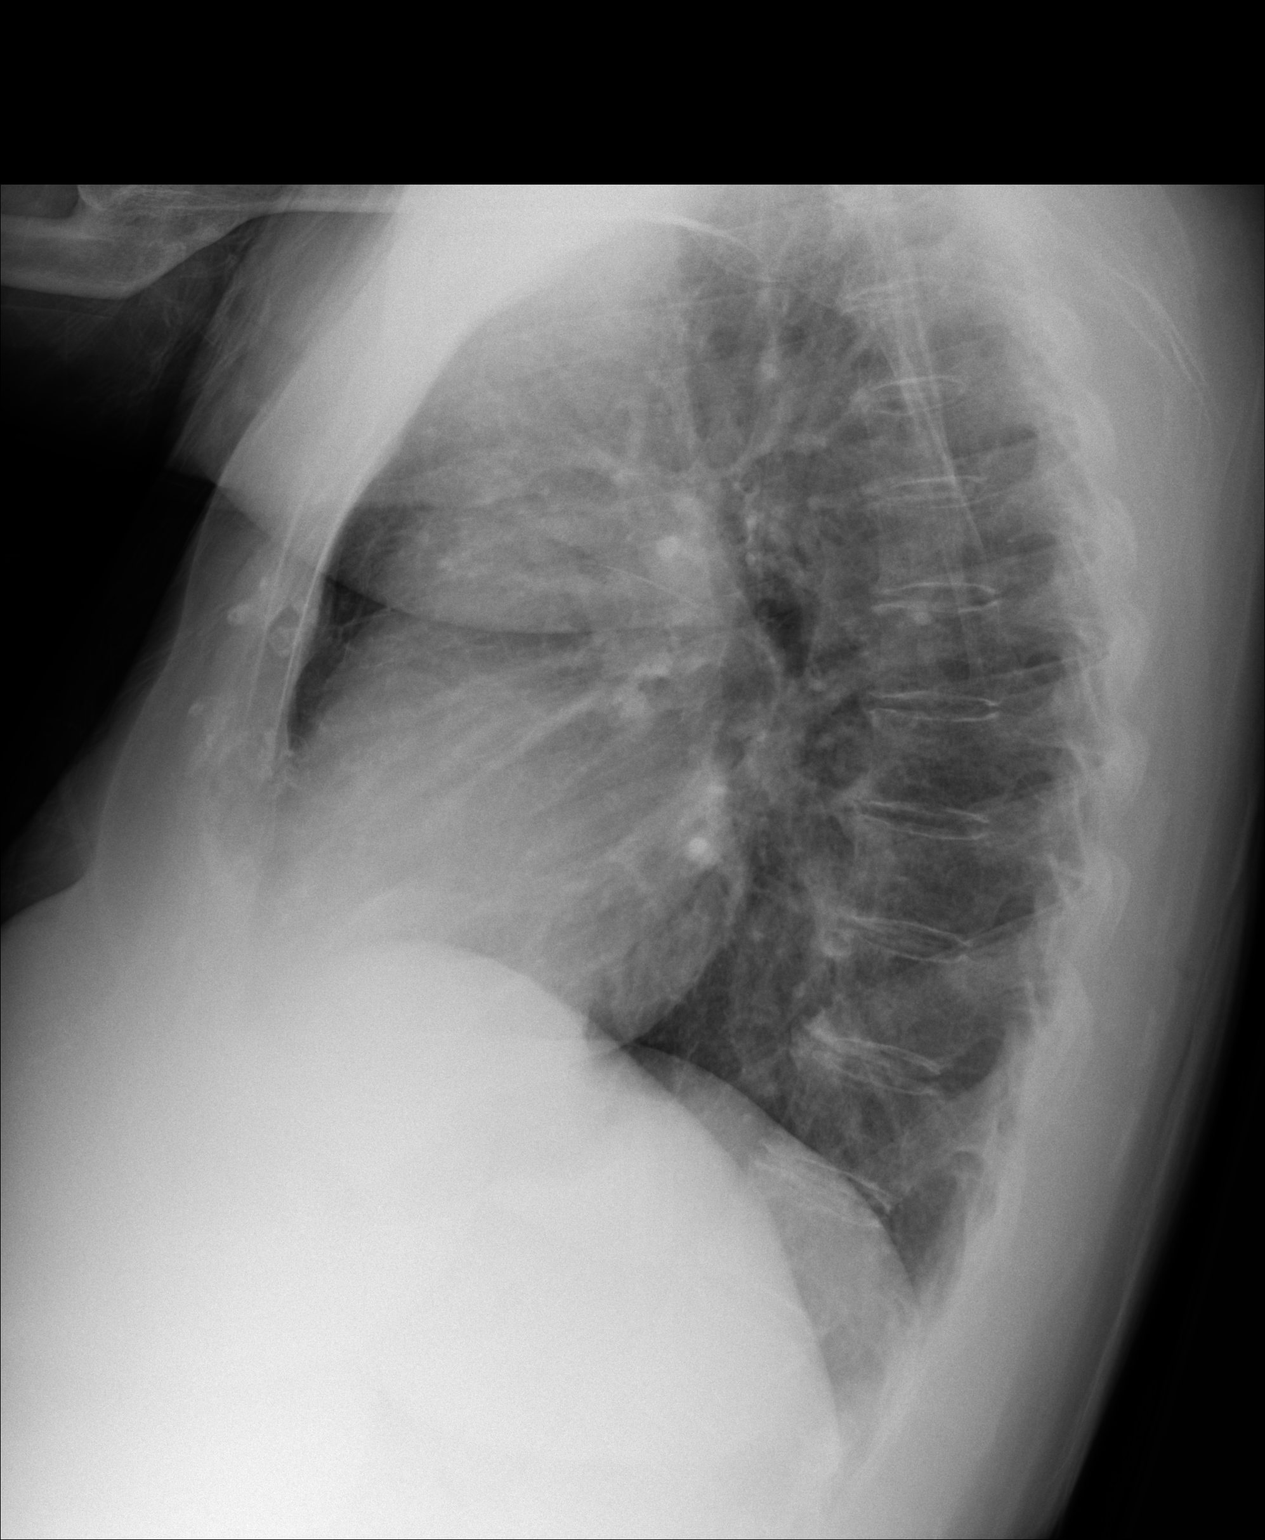
[im 3/3]
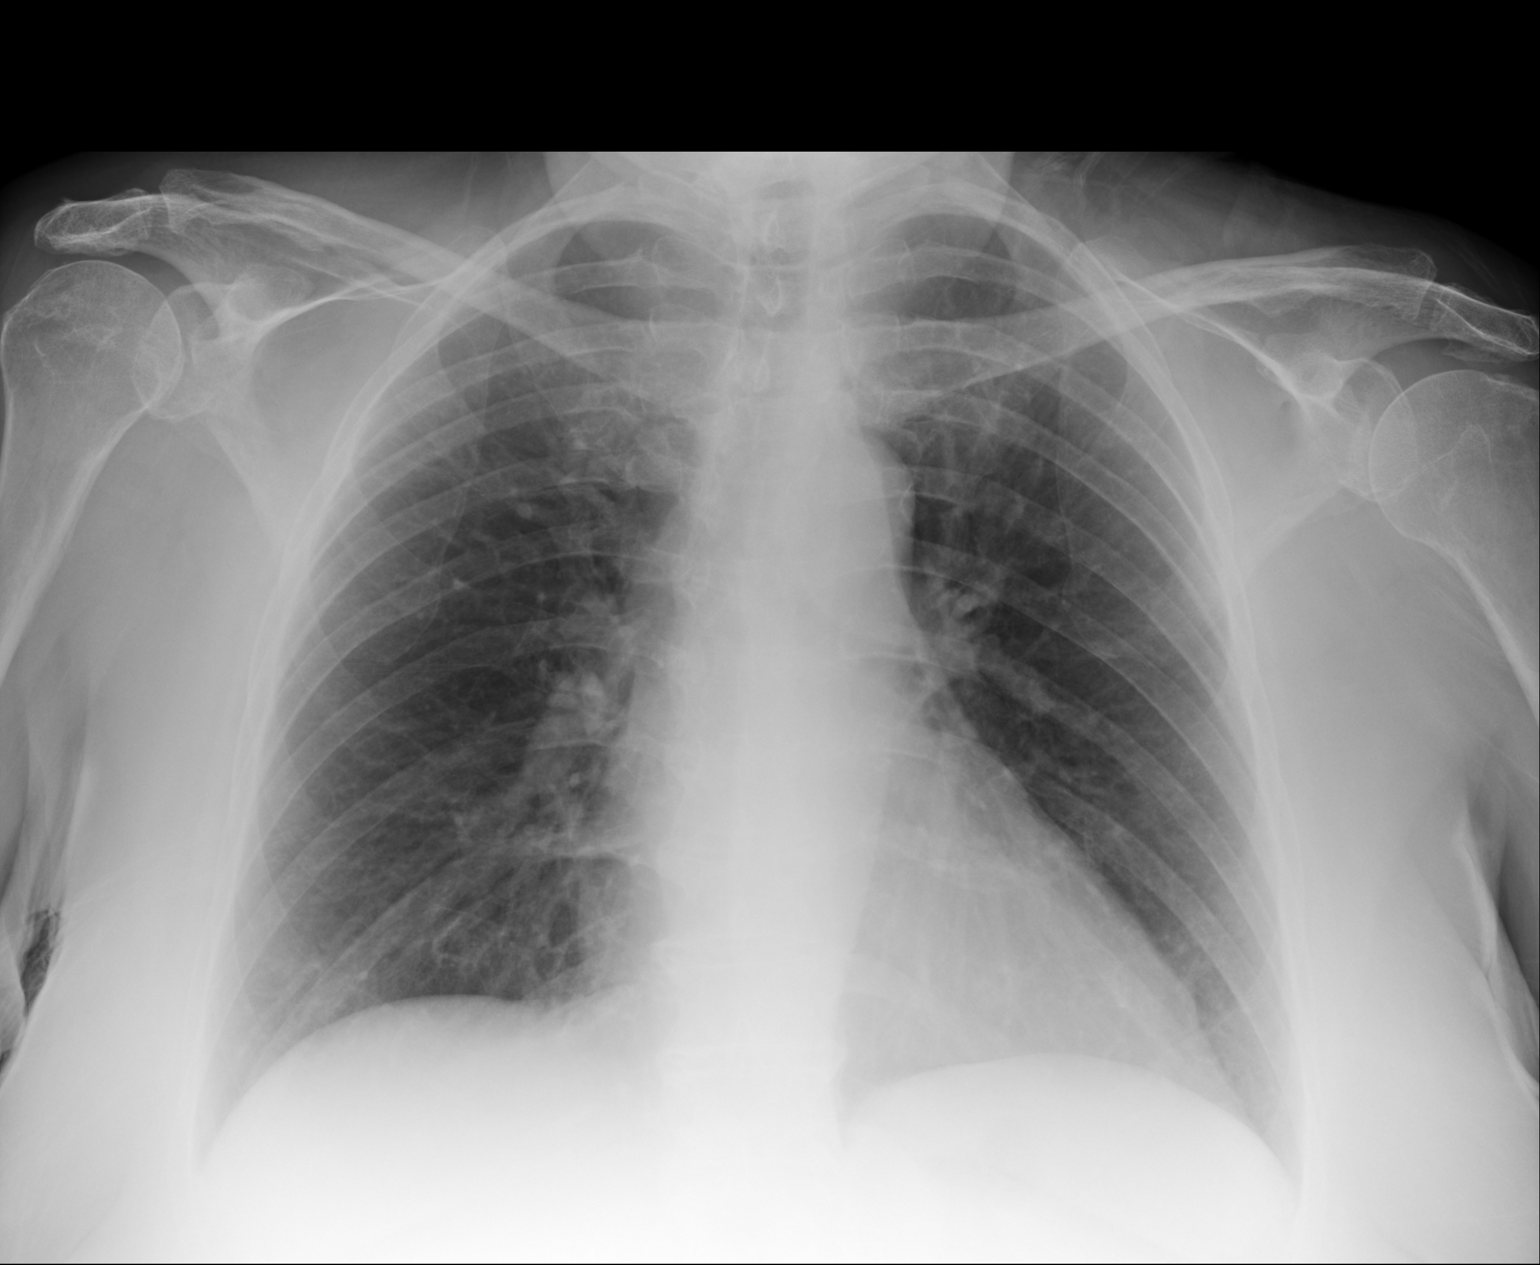

[3 of 3 positions shown; findings below may reference images not displayed]

PROCEDURE:     DXR - DXR CHEST PA (OR AP) AND LATERAL  - December 27, 2011  [DATE]

RESULT:     Comparison is made to the prior exam of 03/16/2011.

The lung fields are clear. No pneumonia, pneumothorax or pleural effusion is
seen. The heart size is upper limits for normal. The mediastinal and osseous
structures show no acute changes.
IMPRESSION: No acute changes are identified.

[REDACTED]

## 2013-11-20 ENCOUNTER — Ambulatory Visit: Payer: Self-pay | Admitting: Gastroenterology

## 2013-12-06 ENCOUNTER — Emergency Department: Payer: Self-pay | Admitting: Emergency Medicine

## 2013-12-06 LAB — URINALYSIS, COMPLETE
BLOOD: NEGATIVE
Bacteria: NONE SEEN
Bilirubin,UR: NEGATIVE
Glucose,UR: NEGATIVE mg/dL (ref 0–75)
Ketone: NEGATIVE
Leukocyte Esterase: NEGATIVE
Nitrite: NEGATIVE
PROTEIN: NEGATIVE
Ph: 8 (ref 4.5–8.0)
SPECIFIC GRAVITY: 1.003 (ref 1.003–1.030)
Squamous Epithelial: NONE SEEN

## 2013-12-18 DIAGNOSIS — K2970 Gastritis, unspecified, without bleeding: Secondary | ICD-10-CM | POA: Insufficient documentation

## 2013-12-28 ENCOUNTER — Encounter: Admitting: Internal Medicine

## 2014-01-01 ENCOUNTER — Encounter: Payer: Self-pay | Admitting: Internal Medicine

## 2014-01-24 ENCOUNTER — Telehealth: Payer: Self-pay | Admitting: Internal Medicine

## 2014-01-24 NOTE — Telephone Encounter (Signed)
The patient is aware of her appointment at [facility] in [place] on 7.9.15 @ 1:00.

## 2014-01-29 ENCOUNTER — Ambulatory Visit: Payer: Self-pay | Admitting: Internal Medicine

## 2014-01-29 LAB — HM MAMMOGRAPHY: HM MAMMO: NEGATIVE

## 2014-01-30 ENCOUNTER — Encounter: Payer: Self-pay | Admitting: Internal Medicine

## 2014-01-31 ENCOUNTER — Ambulatory Visit: Payer: Self-pay

## 2014-02-22 ENCOUNTER — Ambulatory Visit: Payer: Self-pay | Admitting: Family Medicine

## 2014-03-05 ENCOUNTER — Telehealth: Payer: Self-pay | Admitting: Internal Medicine

## 2014-03-05 ENCOUNTER — Encounter: Payer: Self-pay | Admitting: Internal Medicine

## 2014-03-05 ENCOUNTER — Ambulatory Visit: Admitting: Internal Medicine

## 2014-03-05 DIAGNOSIS — Z0000 Encounter for general adult medical examination without abnormal findings: Secondary | ICD-10-CM

## 2014-03-05 NOTE — Telephone Encounter (Signed)
Pt checked in for appt and after sitting for a few minutes. Pt returned to desk and stated that she had to leave. She is having very bad vertigo and needs to reschedule.msn

## 2014-03-05 NOTE — Progress Notes (Deleted)
Pre visit review using our clinic review tool, if applicable. No additional management support is needed unless otherwise documented below in the visit note. 

## 2014-03-05 NOTE — Telephone Encounter (Signed)
Noted  

## 2014-03-05 NOTE — Telephone Encounter (Signed)
Pt has a long history of vertigo and has been worked up extensively.  Later, please check on her and confirm doing ok.  Sh ewill need to reschedule her appt (31min).  Thanks.

## 2014-03-05 NOTE — Telephone Encounter (Signed)
Pt states that she is doing a little bit better, just wasn't prepared to have a physical.

## 2014-03-05 NOTE — Telephone Encounter (Signed)
FYI

## 2014-03-09 NOTE — Progress Notes (Signed)
Pt left without being seen.  Was having vertigo and wanted to leave.

## 2014-04-09 IMAGING — US TRANSABDOMINAL ULTRASOUND OF PELVIS
1 series · 14 of 25 positions shown · non-contrast
Comparison: none

REASON FOR EXAM: Lower abdominal fullness, increased gas
COMMENTS:

[Series 1: transabdominal ultrasound of pelvis · 0.35mm/px · 14 of 87 slices shown]
[im 1/87]
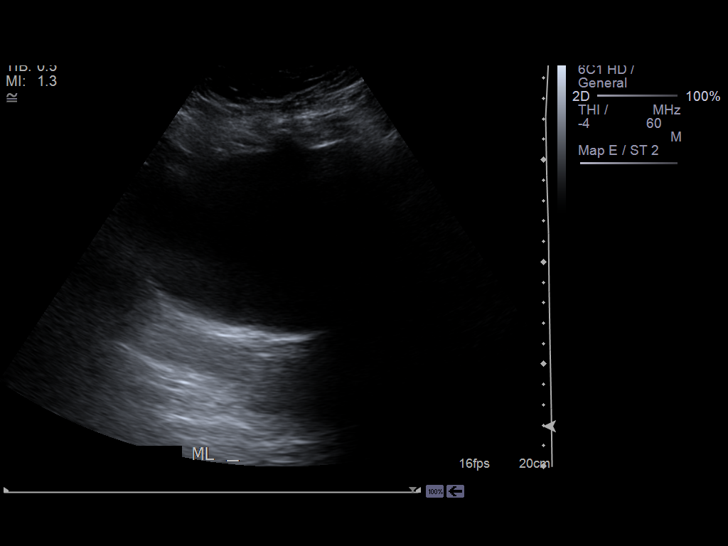
[im 8/87]
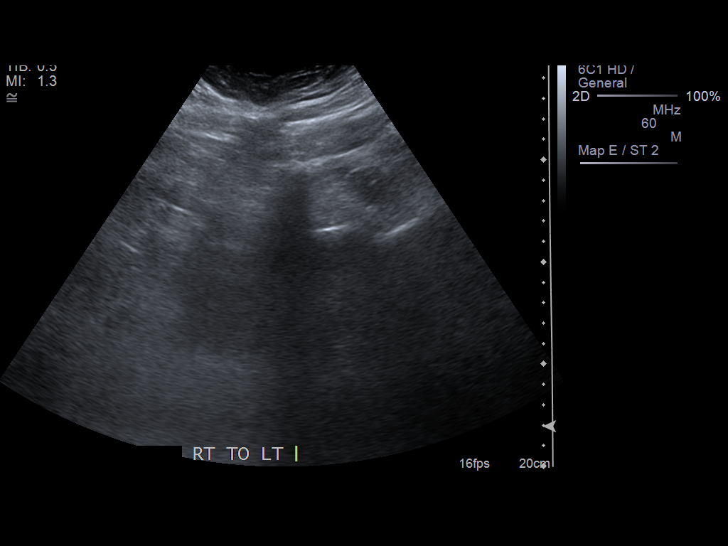
[im 15/87]
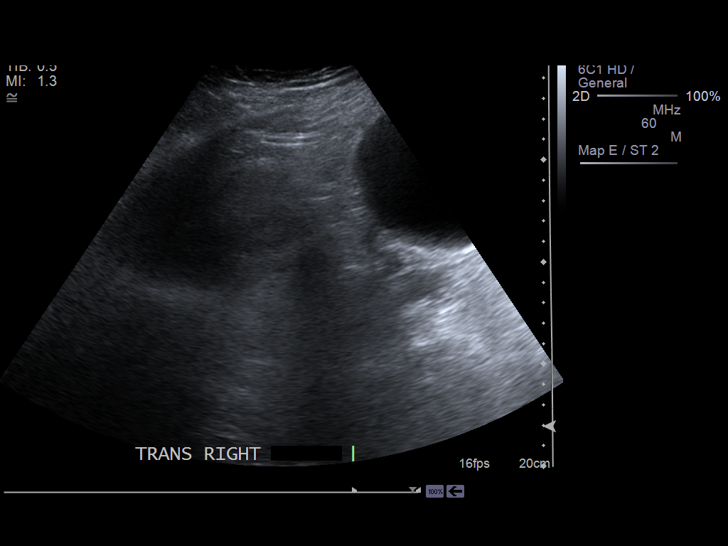
[im 22/87]
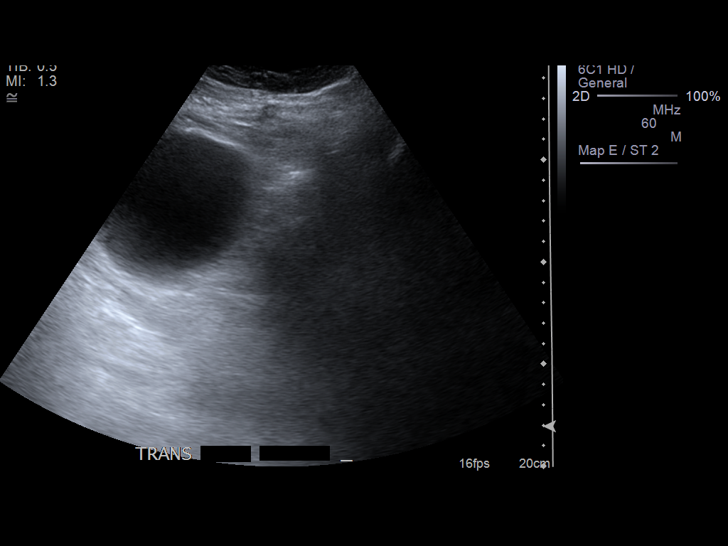
[im 29/87]
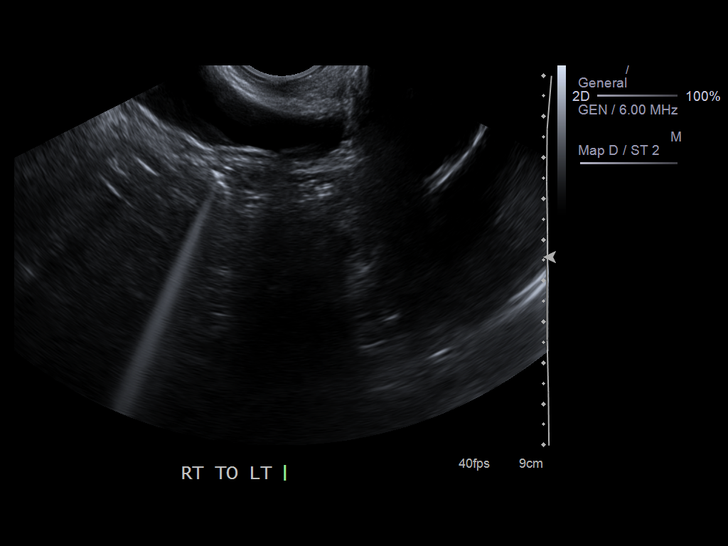
[im 33/87]
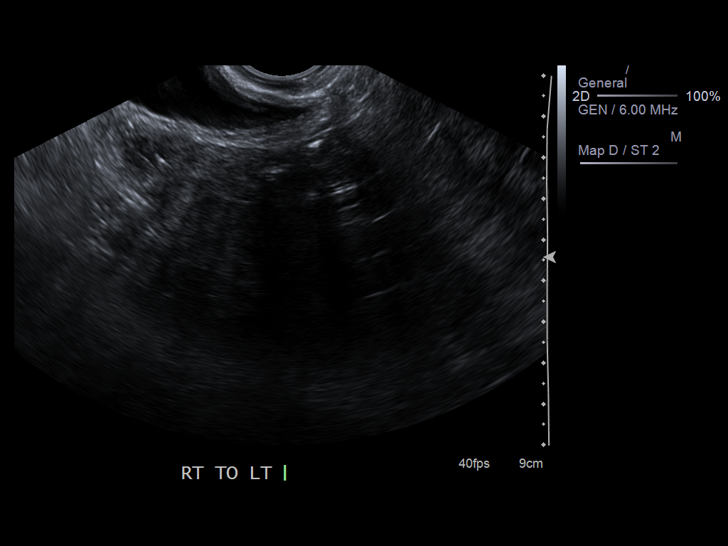
[im 40/87]
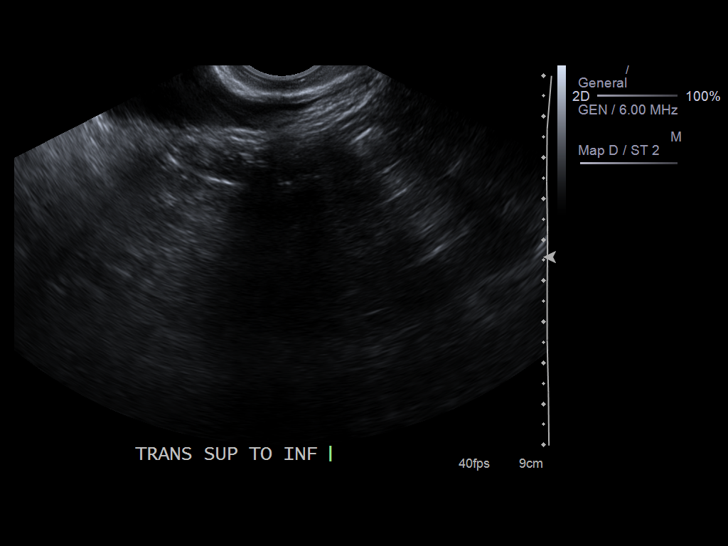
[im 47/87]
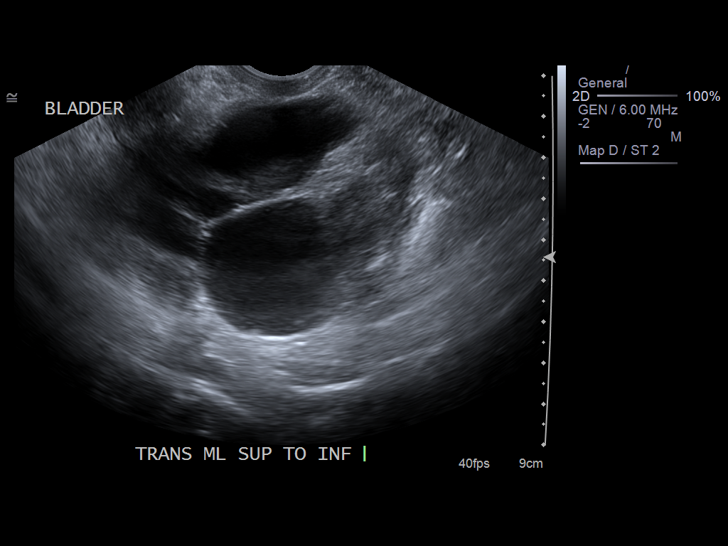
[im 54/87]
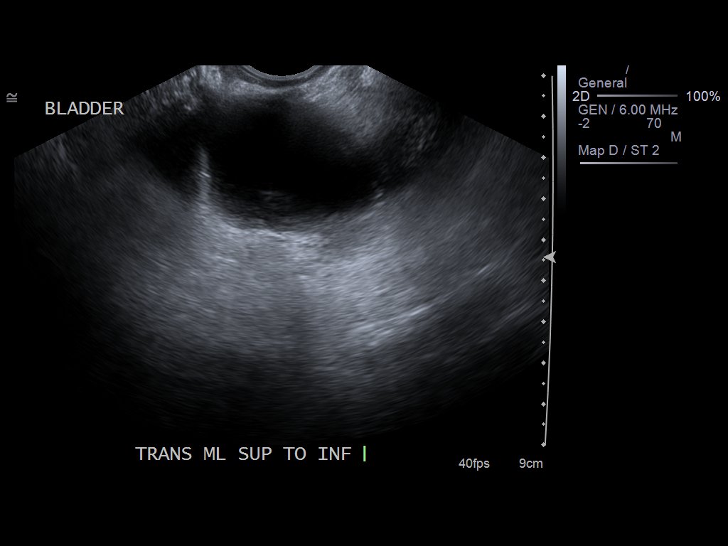
[im 58/87]
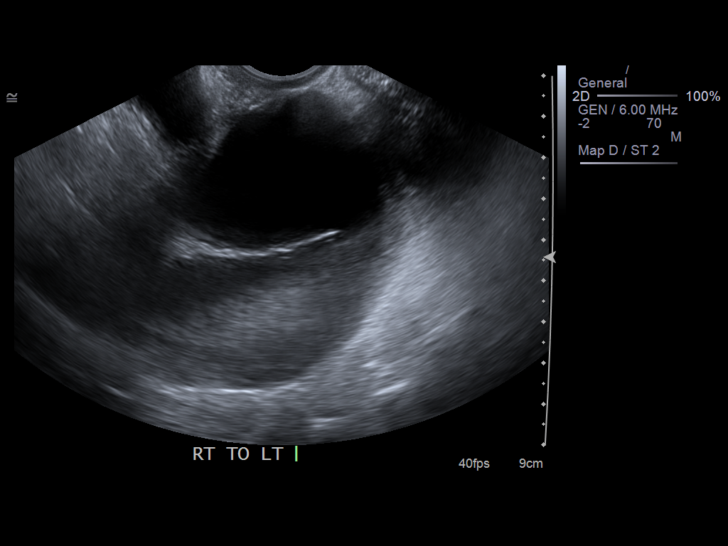
[im 65/87]
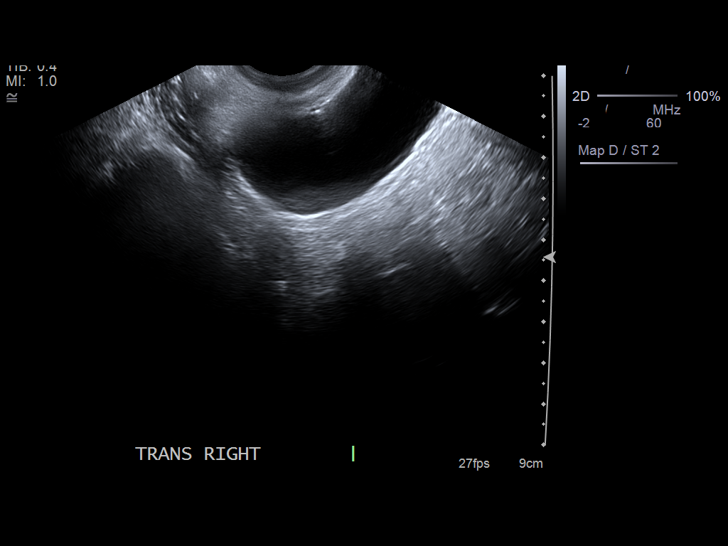
[im 72/87]
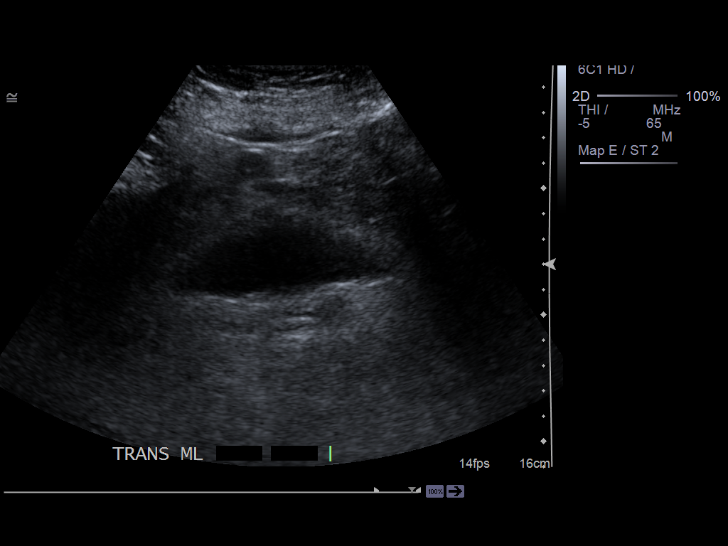
[im 79/87]
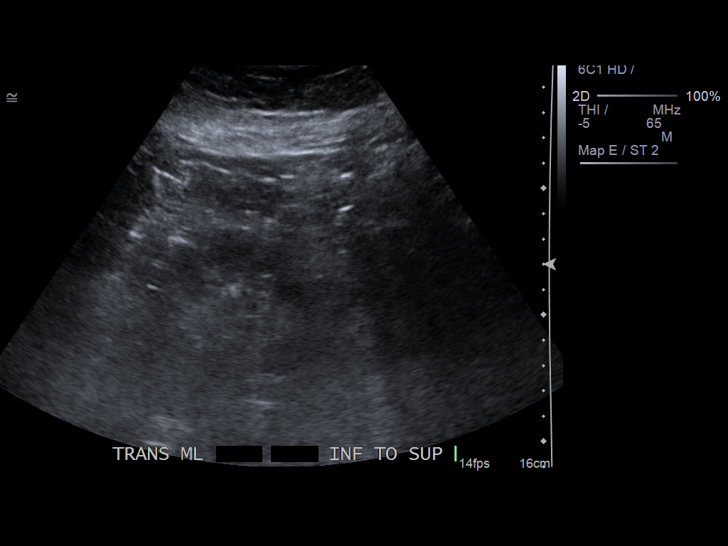
[im 87/87]
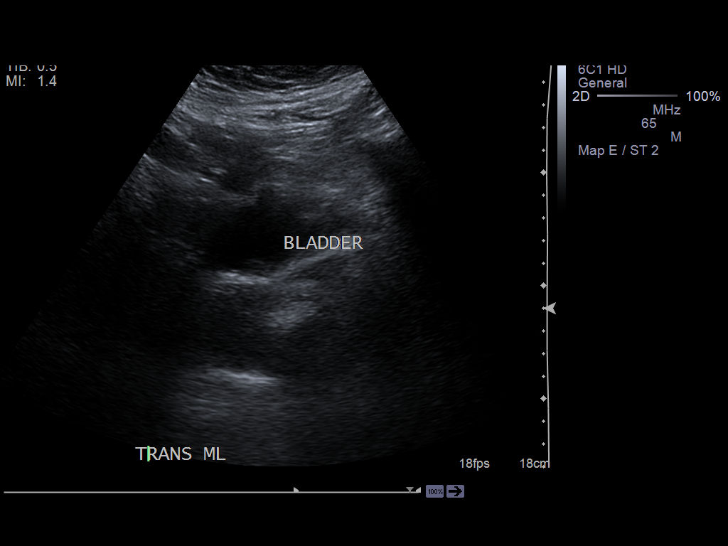

[14 of 25 positions shown; findings below may reference images not displayed]

PROCEDURE:  [id number] PELVIS NON-OB WITH TRANSVAGINAL  May 22, 2012 [DATE]

RESULT:      There is a history of hysterectomy. There is a large cystic
mass with septations from the midline toward the left measuring 9.03 x
x 6.79 cm. This is only seen on endovaginal imaging. Normal ovarian tissue
is not identified. No right ovarian tissue is evident. The kidneys show no
obstructive change or discrete mass.
IMPRESSION: Large, septated, predominately cystic mass in the midline
to left adnexal pelvic regions. Normal ovarian tissue is not identified.
Gynecologic follow-up is recommended. A predominantly cystic, septated mass
of ovarian etiology is not excluded.

[REDACTED]

## 2014-04-30 ENCOUNTER — Ambulatory Visit (INDEPENDENT_AMBULATORY_CARE_PROVIDER_SITE_OTHER): Admitting: Internal Medicine

## 2014-04-30 VITALS — BP 130/80 | HR 79 | Temp 98.7°F | Ht 67.5 in | Wt 186.2 lb

## 2014-04-30 DIAGNOSIS — K2970 Gastritis, unspecified, without bleeding: Secondary | ICD-10-CM

## 2014-04-30 DIAGNOSIS — G4730 Sleep apnea, unspecified: Secondary | ICD-10-CM

## 2014-04-30 DIAGNOSIS — E220 Acromegaly and pituitary gigantism: Secondary | ICD-10-CM

## 2014-04-30 DIAGNOSIS — R42 Dizziness and giddiness: Secondary | ICD-10-CM

## 2014-04-30 DIAGNOSIS — E2839 Other primary ovarian failure: Secondary | ICD-10-CM

## 2014-04-30 DIAGNOSIS — E7800 Pure hypercholesterolemia, unspecified: Secondary | ICD-10-CM

## 2014-04-30 DIAGNOSIS — M1990 Unspecified osteoarthritis, unspecified site: Secondary | ICD-10-CM

## 2014-04-30 DIAGNOSIS — L400 Psoriasis vulgaris: Secondary | ICD-10-CM

## 2014-04-30 LAB — CBC WITH DIFFERENTIAL/PLATELET
Basophils Absolute: 0.1 10*3/uL (ref 0.0–0.1)
Basophils Relative: 0.5 % (ref 0.0–3.0)
EOS PCT: 0.6 % (ref 0.0–5.0)
Eosinophils Absolute: 0.1 10*3/uL (ref 0.0–0.7)
HCT: 41 % (ref 36.0–46.0)
Hemoglobin: 13.6 g/dL (ref 12.0–15.0)
Lymphocytes Relative: 30.4 % (ref 12.0–46.0)
Lymphs Abs: 2.9 10*3/uL (ref 0.7–4.0)
MCHC: 33.3 g/dL (ref 30.0–36.0)
MCV: 90.9 fl (ref 78.0–100.0)
MONO ABS: 0.7 10*3/uL (ref 0.1–1.0)
Monocytes Relative: 7 % (ref 3.0–12.0)
NEUTROS PCT: 61.5 % (ref 43.0–77.0)
Neutro Abs: 5.9 10*3/uL (ref 1.4–7.7)
Platelets: 436 10*3/uL — ABNORMAL HIGH (ref 150.0–400.0)
RBC: 4.51 Mil/uL (ref 3.87–5.11)
RDW: 13.8 % (ref 11.5–15.5)
WBC: 9.6 10*3/uL (ref 4.0–10.5)

## 2014-04-30 NOTE — Progress Notes (Signed)
Pre visit review using our clinic review tool, if applicable. No additional management support is needed unless otherwise documented below in the visit note. 

## 2014-05-01 LAB — COMPREHENSIVE METABOLIC PANEL
ALBUMIN: 4.3 g/dL (ref 3.5–5.2)
ALK PHOS: 62 U/L (ref 39–117)
ALT: 17 U/L (ref 0–35)
AST: 24 U/L (ref 0–37)
BUN: 12 mg/dL (ref 6–23)
CALCIUM: 9.7 mg/dL (ref 8.4–10.5)
CHLORIDE: 101 meq/L (ref 96–112)
CO2: 28 mEq/L (ref 19–32)
Creatinine, Ser: 0.5 mg/dL (ref 0.4–1.2)
GFR: 127.87 mL/min (ref >60.00)
GLUCOSE: 83 mg/dL (ref 70–99)
POTASSIUM: 4.5 meq/L (ref 3.5–5.1)
SODIUM: 139 meq/L (ref 135–145)
TOTAL PROTEIN: 7.8 g/dL (ref 6.0–8.3)
Total Bilirubin: 0.5 mg/dL (ref 0.2–1.2)

## 2014-05-01 LAB — LIPID PANEL
CHOLESTEROL: 210 mg/dL — AB (ref 0–200)
HDL: 43.4 mg/dL (ref >39.00)
LDL Cholesterol: 140 mg/dL — ABNORMAL HIGH (ref 0–99)
NONHDL: 166.6
Total CHOL/HDL Ratio: 5
Triglycerides: 134 mg/dL (ref 0.0–149.0)
VLDL: 26.8 mg/dL (ref 0.0–40.0)

## 2014-05-01 LAB — TSH: TSH: 1.18 u[IU]/mL (ref 0.35–4.50)

## 2014-05-02 ENCOUNTER — Encounter: Payer: Self-pay | Admitting: Internal Medicine

## 2014-05-02 LAB — GROWTH HORMONE

## 2014-05-02 LAB — INSULIN-LIKE GROWTH FACTOR: Somatomedin (IGF-I): 136 ng/mL (ref 31–179)

## 2014-05-03 ENCOUNTER — Encounter: Payer: Self-pay | Admitting: Internal Medicine

## 2014-05-05 ENCOUNTER — Encounter: Payer: Self-pay | Admitting: Internal Medicine

## 2014-05-05 DIAGNOSIS — E2839 Other primary ovarian failure: Secondary | ICD-10-CM | POA: Insufficient documentation

## 2014-05-05 DIAGNOSIS — L400 Psoriasis vulgaris: Secondary | ICD-10-CM | POA: Insufficient documentation

## 2014-05-05 NOTE — Assessment & Plan Note (Signed)
Stable.  Has had extensive w/up.  Follow.

## 2014-05-05 NOTE — Progress Notes (Signed)
Subjective:    Patient ID: Melinda [name], female    DOB: 1956-07-12, 58 y.o.   MRN: [id number]  HPI 58 year old female with past history of hypercholesterolemia and vertigo (s/p extensive w/up - followed by neurology) who comes in today to follow up on these issues as well as for a complete physical exam.   No acid reflux.  Bowels are regular.  She is eating and drinking.   No nausea or vomiting.  On a stool softener and taking a probiotic.  Urinary issues - stable.  Has been seen at [facility]  Diagnosed with plaque psoriasis.  Uses clobetasol.  Seeing Brandt Loosen.  Also seeing Dr Dorthula Nettles for her eyes.  Had her flu shot.  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  Need to get updated immunization record.  Overall doing better.  Handling stress relatively well.        Past Medical History  Diagnosis Date  . Arthritis   . Vertigo   . Hypercholesterolemia   . Sleep apnea     Current Outpatient Prescriptions on File Prior to Visit  Medication Sig Dispense Refill  . Calcium Carb-Cholecalciferol (CALCIUM-VITAMIN D) 600-400 MG-UNIT TABS Take 1 tablet by mouth daily.      Marland Kitchen co-enzyme Q-10 30 MG capsule Take 100 mg by mouth 2 (two) times daily.       . Cranberry 450 MG CAPS Take 1 capsule by mouth daily.      . Magnesium 250 MG TABS Take 125 mg by mouth.       . meclizine (ANTIVERT) 25 MG tablet Take 12.5 mg by mouth 2 (two) times daily.       . Multiple Vitamin (MULTIVITAMIN) tablet Take 1 tablet by mouth daily.        No current facility-administered medications on file prior to visit.    Review of Systems Patient denies any headache.  Dizziness/light headedness - unchanged.  Has been worked up extensively.  Saw ENT and Neurology.   No palpitations.  Denies any chest pain.  No increased shortness of breath, cough or congestion.  No nausea or vomiting.  No abdominal pain or cramping.  No acid reflux.   No BRBPR or melana.  Bowels stable.  No urine change now.  Urinating without difficulty.   Diagnosed with plaque psoriasis.  Seeing Brandt Loosen.       Objective:   Physical Exam  Filed Vitals:   04/30/14 1035  BP: 130/80  Pulse: 79  Temp: 98.7 F (62.70 C)   58 year old female in no acute distress.   HEENT:  Nares- clear.  Oropharynx - without lesions. NECK:  Supple.  Nontender.  No audible bruit.  HEART:  Appears to be regular. LUNGS:  No crackles or wheezing audible.  Respirations even and unlabored.  RADIAL PULSE:  Equal bilaterally.    BREASTS:  No nipple discharge or nipple retraction present.  Could not appreciate any distinct nodules or axillary adenopathy.  ABDOMEN:  Soft, nontender.  Bowel sounds present and normal.  No audible abdominal bruit.  GU:  Not performed.    EXTREMITIES:  No increased edema present.  DP pulses palpable and equal bilaterally.          Assessment & Plan:  GYN.  S/p surgery.  States everything checked out fine.  Benign.  Has been released.  Currently doing well.    GU.  Denies any problems with urination.    CARDIOVASCULAR.  Sees Dr Nehemiah Massed.  Per his note, the  stress test revealed normal LV function at peak stress.  There were changes in the inferior intraseptal area.  These changes were not associated with symptoms or EKG changes.  He did not feel this was clinically significant and did not feel further cardiac w/up warranted.  Currently doing well.  Follow.    INCREASED PSYCHOSOCIAL STRESSORS.  Increased stress as outlined in previous notes.  Better now.    CANNOT RULE OUT ACROMEGALY.  Check GH and insulin like growth factor.    HEALTH MAINTENANCE.  Physical today.   GYN exam - through [facility].   Mammogram 11/03/12 - Birads I.  Due f/u mammogram.

## 2014-05-05 NOTE — Assessment & Plan Note (Signed)
Low cholesterol diet and exercise.  Follow lipid panel.   

## 2014-05-05 NOTE — Assessment & Plan Note (Signed)
Using her CPAP machine nightly and also during naps. Tolerating well.

## 2014-05-05 NOTE — Assessment & Plan Note (Signed)
Using clobetasol.  Followed by dermatology.

## 2014-05-05 NOTE — Assessment & Plan Note (Signed)
With her being post menopausal, will check bone density.

## 2014-05-05 NOTE — Assessment & Plan Note (Signed)
Relatively stable. 

## 2014-05-05 NOTE — Assessment & Plan Note (Signed)
EGD 11/20/13 - mild gastritis.  Currently doing well.  Follow.

## 2014-05-06 ENCOUNTER — Ambulatory Visit: Payer: Self-pay

## 2014-05-07 NOTE — Telephone Encounter (Signed)
Unread mychart message mailed  

## 2014-05-13 ENCOUNTER — Telehealth: Payer: Self-pay | Admitting: Internal Medicine

## 2014-05-13 NOTE — Telephone Encounter (Signed)
Melinda [name] called saying she received a letter in the mail regarding a "patient unread message alert." She said she does NOT want to have her MyChart discontinued and she agrees that she will contact us if she needs anything further. She wanted to make Dr. Nicki Reaper aware of this.  Thank you.

## 2014-05-16 NOTE — Telephone Encounter (Signed)
Unread mychart message mailed to patient 

## 2014-05-20 ENCOUNTER — Telehealth: Payer: Self-pay

## 2014-05-20 NOTE — Telephone Encounter (Signed)
Need more information.  What has she been in for?  Was she evaluated?  If yes, then need records.  If no, please call pt and see how she is doing.  Any acute issues or symptoms - needs evaluation.

## 2014-05-20 NOTE — Telephone Encounter (Signed)
A CMA from [facility] called and wanted to relay to Dr.Scott that the patient has been in several times and seems confused and unlike herself.  They stated she was coming for a bone scan, but has repeatedly come in questioning her appointment.  They also mentioned she is having a hard time getting back to her car after leaving the building.

## 2014-05-20 NOTE — Telephone Encounter (Signed)
I called [facility], actually the call came from Outpatient Registration from a Registrar.  April called states it was brought to her attention that the patient had come in last week and today to have a Bone Density Scan that is scheduled in November.  April further states both times the staff tells the pt when her appointment is and gives her an appointment card.  Today is was also brought to Aprils attention by the volunteer that pt had a hard time remembering where she had parked her car.  I attempted to call the pt with no answer.

## 2014-05-20 NOTE — Telephone Encounter (Signed)
Please advise 

## 2014-05-20 NOTE — Telephone Encounter (Signed)
Please continue to try and contact pt and f/u with how she is doing.  Any acute symptoms needs evaluation.

## 2014-05-21 ENCOUNTER — Emergency Department: Payer: Self-pay | Admitting: Emergency Medicine

## 2014-05-21 NOTE — Telephone Encounter (Addendum)
I offered pt an appoint with Dr Nicki Reaper, pt refused.  Pt aware she may cancel appoint if wishes to.

## 2014-05-21 NOTE — Telephone Encounter (Signed)
If pt is having sob, etc, I recommend evaluation today.  I am unable to work her in today, but would recommend acute care evaluation and then I can f/u after.  Also, it is ok if she wants to cancel her bone density appt.

## 2014-05-21 NOTE — Telephone Encounter (Signed)
Spoke with pt, she states she has an appoint for 11.3.15 at 2pm for bone density,  She states she does not know whether she is going to appoint as she is having difficulty breathing.  Pt further states she "just wants to take a break".  Pt is repeating herself.  Called pt again asked if I could contact her son Shanon Brow, to ask questions pt said no.

## 2014-11-08 ENCOUNTER — Other Ambulatory Visit: Payer: Self-pay | Admitting: Obstetrics and Gynecology

## 2014-11-08 DIAGNOSIS — Z1231 Encounter for screening mammogram for malignant neoplasm of breast: Secondary | ICD-10-CM

## 2014-11-15 NOTE — Op Note (Signed)
PATIENT NAME:  Melinda [name], Melinda [name] MR#:  [id number] DATE OF BIRTH:  02-25-1956  DATE OF PROCEDURE:  08/08/2012  PREOPERATIVE DIAGNOSIS: Pelvic mass.   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  Laparoscopic right salpingectomy and removal of pelvic mass.  2.  Cystoscopy.   ANESTHESIA: General.   SURGEON: Prentice Docker, M.D.   ASSISTANT: Donzetta Matters, MD  ESTIMATED BLOOD LOSS: 50 mL  OPERATIVE FLUIDS: 2200 mL crystalloid.   COMPLICATIONS: None.   FINDINGS: 1.  No apparent left fallopian tube or ovary.  2.  Greatly distended right fallopian tube and possible ovarian cyst.  3. 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   SPECIMENS:  1.  Right tube and ovary.  2.  Cyst.   PROCEDURE IN DETAIL: After the patient was met in the preoperative area, she was taken to the operating room where general anesthesia was administered and found to be adequate. She was placed in the dorsal supine lithotomy position and prepped and draped in the usual sterile fashion. After timeout was called, an indwelling Foley catheter was placed in her bladder. Given her surgical history, an open direct entry was attempted infraumbilically and successful entry into the abdomen was gained and a Hassan trocar was placed. After insufflation of the abdomen with CO2, a left lower quadrant 5 mm port was placed under direct intra-abdominal camera visualization. Similarly, a right lower quadrant 10 mm was placed under direct intra-abdominal camera visualization. After verifying atraumatic entry, attention was turned to the pelvic area where multiple filmy adhesions of the omentum as well as small bowel and large bowel were noted to a pelvic mass. These adhesions were taken down laparoscopically very gently. Eventually the right IP ligament was identified. The right ureter was identified and the right IP ligament was cauterized  using bipolar electrocautery and then transected using the Harmonic scalpel. Further dissection of the mass was undertaken. It appeared that this was a hydrosalpinx at this point, however, further dissection of the mass was undertaken to completely free it up from its pelvic adhesions, which was successful. The mass was then placed in an Endo Catch bag and removed through the right lower quadrant port without difficulty. There was leakage of contents of the mass, which was drained through suction.   At this point, cystoscopy was undertaken as the right ureter was so close to the transection of the IP ligament, though visualized prior to transection and bilateral ureteral orifices efflux was noted during cystoscopy of Indigo Carmine solution. Attention was then turned back to the abdomen and the pelvis and abdomen was found to be hemostatic. At this point the procedure was terminated and the left lower quadrant port was removed as well at the right lower quadrant. The [facility] trocars opened and all the air was desufflated from the abdomen. The [facility] trocar was removed and the fascia was reapproximated using #0 Vicryl. The skin was reapproximated using a subcuticular stitch 3-0 Monocryl. All skin incisions were closed using Dermabond.   The patient tolerated the procedure well. Sponge, lap, and needle counts were correct x 2. For VTE prophylaxis, the patient was wearing SCDs which were on and operating throughout the entire procedure. Additionally, she received Lovenox 40 mg subcutaneously On Call to the operating room prior to the procedure.     ____________________________ Will Bonnet, MD sdj:cc D: 08/08/2012 19:07:23 ET T: 08/08/2012 23:59:29 ET JOB#: [id number]  cc: Will Bonnet, MD, <Dictator> Breya Cass D Glennon Mac  MD ELECTRONICALLY SIGNED 09/07/2012 17:34

## 2014-11-22 ENCOUNTER — Other Ambulatory Visit: Payer: Self-pay

## 2014-11-22 DIAGNOSIS — Z1231 Encounter for screening mammogram for malignant neoplasm of breast: Secondary | ICD-10-CM

## 2014-12-12 ENCOUNTER — Emergency Department
Admission: EM | Admit: 2014-12-12 | Discharge: 2014-12-12 | Disposition: A | Discharge status: Home / Self Care | Attending: Emergency Medicine | Admitting: Emergency Medicine

## 2014-12-12 ENCOUNTER — Ambulatory Visit (INDEPENDENT_AMBULATORY_CARE_PROVIDER_SITE_OTHER)
Admission: EM | Admit: 2014-12-12 | Discharge: 2014-12-12 | Discharge status: Against Medical Advice | Source: Home / Self Care | Attending: Family Medicine | Admitting: Family Medicine

## 2014-12-12 ENCOUNTER — Encounter: Payer: Self-pay | Admitting: General Practice

## 2014-12-12 ENCOUNTER — Emergency Department

## 2014-12-12 DIAGNOSIS — R339 Retention of urine, unspecified: Secondary | ICD-10-CM | POA: Diagnosis not present

## 2014-12-12 DIAGNOSIS — W1839XA Other fall on same level, initial encounter: Secondary | ICD-10-CM | POA: Diagnosis not present

## 2014-12-12 DIAGNOSIS — Y9389 Activity, other specified: Secondary | ICD-10-CM | POA: Diagnosis not present

## 2014-12-12 DIAGNOSIS — Z79899 Other long term (current) drug therapy: Secondary | ICD-10-CM | POA: Insufficient documentation

## 2014-12-12 DIAGNOSIS — Z043 Encounter for examination and observation following other accident: Secondary | ICD-10-CM | POA: Diagnosis not present

## 2014-12-12 DIAGNOSIS — Y998 Other external cause status: Secondary | ICD-10-CM | POA: Diagnosis not present

## 2014-12-12 DIAGNOSIS — W19XXXA Unspecified fall, initial encounter: Secondary | ICD-10-CM | POA: Diagnosis not present

## 2014-12-12 DIAGNOSIS — M5489 Other dorsalgia: Secondary | ICD-10-CM

## 2014-12-12 DIAGNOSIS — M545 Low back pain, unspecified: Secondary | ICD-10-CM

## 2014-12-12 DIAGNOSIS — R32 Unspecified urinary incontinence: Secondary | ICD-10-CM | POA: Diagnosis present

## 2014-12-12 DIAGNOSIS — Y9289 Other specified places as the place of occurrence of the external cause: Secondary | ICD-10-CM | POA: Insufficient documentation

## 2014-12-12 LAB — URINALYSIS COMPLETE WITH MICROSCOPIC (ARMC ONLY)
BILIRUBIN URINE: NEGATIVE
Glucose, UA: NEGATIVE mg/dL
HGB URINE DIPSTICK: NEGATIVE
Ketones, ur: NEGATIVE mg/dL
Leukocytes, UA: NEGATIVE
Nitrite: NEGATIVE
Protein, ur: NEGATIVE mg/dL
RBC / HPF: NONE SEEN RBC/hpf (ref 0–5)
Specific Gravity, Urine: 1.002 — ABNORMAL LOW (ref 1.005–1.030)
pH: 8 (ref 5.0–8.0)

## 2014-12-12 LAB — CBC
HCT: 43.4 % (ref 35.0–47.0)
HEMOGLOBIN: 14.1 g/dL (ref 12.0–16.0)
MCH: 29.5 pg (ref 26.0–34.0)
MCHC: 32.5 g/dL (ref 32.0–36.0)
MCV: 91 fL (ref 80.0–100.0)
PLATELETS: 398 10*3/uL (ref 150–440)
RBC: 4.77 MIL/uL (ref 3.80–5.20)
RDW: 13.4 % (ref 11.5–14.5)
WBC: 12.8 10*3/uL — ABNORMAL HIGH (ref 3.6–11.0)

## 2014-12-12 LAB — COMPREHENSIVE METABOLIC PANEL
ALT: 20 U/L (ref 14–54)
ANION GAP: 10 (ref 5–15)
AST: 25 U/L (ref 15–41)
Albumin: 4.3 g/dL (ref 3.5–5.0)
Alkaline Phosphatase: 62 U/L (ref 38–126)
BILIRUBIN TOTAL: 0.4 mg/dL (ref 0.3–1.2)
BUN: 11 mg/dL (ref 6–20)
CHLORIDE: 104 mmol/L (ref 101–111)
CO2: 28 mmol/L (ref 22–32)
CREATININE: 0.69 mg/dL (ref 0.44–1.00)
Calcium: 9.7 mg/dL (ref 8.9–10.3)
GFR calc Af Amer: >60 mL/min (ref >60)
GLUCOSE: 98 mg/dL (ref 65–99)
POTASSIUM: 3.9 mmol/L (ref 3.5–5.1)
Sodium: 142 mmol/L (ref 135–145)
Total Protein: 8 g/dL (ref 6.5–8.1)

## 2014-12-12 MED ORDER — FOSFOMYCIN TROMETHAMINE 3 G PO PACK
PACK | ORAL | Status: AC
  Administered 2014-12-12: 3 g via ORAL
  Filled 2014-12-12: qty 3

## 2014-12-12 MED ORDER — FOSFOMYCIN TROMETHAMINE 3 G PO PACK
3.0000 g | PACK | Freq: Once | ORAL | Status: AC
  Administered 2014-12-12: 3 g via ORAL
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
## 2014-12-12 NOTE — ED Notes (Signed)
Pt discharged home after verbalizing understanding of discharge instructions; nad noted. 

## 2014-12-12 NOTE — ED Notes (Signed)
Pt to mri 

## 2014-12-12 NOTE — Discharge Instructions (Signed)
Acute Urinary Retention Only take your neck was seen as needed. Do not take it unless needed for dizziness. Acute urinary retention is the temporary inability to urinate. This is an uncommon problem in women. It can be caused by:  Infection.  A side effect of a medicine.  A problem in a nearby organ that presses or squeezes on the bladder or the urethra (the tube that drains the bladder).  Psychological problems.   Surgery on your bladder, urethra, or pelvic organs that causes obstruction to the outflow of urine from your bladder. HOME CARE INSTRUCTIONS  If you are sent home with a Foley catheter and a drainage system, you will need to discuss the best course of action with your health care provider. While the catheter is in, maintain a good intake of fluids. Keep the drainage bag emptied and lower than your catheter. This is so that contaminated urine will not flow back into your bladder, which could lead to a urinary tract infection. There are two main types of drainage bags. One is a large bag that usually is used at night. It has a good capacity that will allow you to sleep through the night without having to empty it. The second type is called a leg bag. It has a smaller capacity so it needs to be emptied more frequently. However, the main advantage is that it can be attached by a leg strap and goes underneath your clothing, allowing you the freedom to move about or leave your home. Only take over-the-counter or prescription medicines for pain, discomfort, or fever as directed by your health care provider.  SEEK MEDICAL CARE IF:  You develop a low-grade fever.  You experience spasms or leakage of urine with the spasms. SEEK IMMEDIATE MEDICAL CARE IF:   You develop chills or fever.  Your catheter stops draining urine.  Your catheter falls out.  You start to develop increased bleeding that does not respond to rest and increased fluid intake. MAKE SURE YOU:  Understand these  instructions.  Will watch your condition.  Will get help right away if you are not doing well or get worse. Document Released: 07/11/2006 Document Revised: 05/02/2013 Document Reviewed: 12/21/2012 [facility] Patient Information 2015 [place], [place]. This information is not intended to replace advice given to you by your health care provider. Make sure you discuss any questions you have with your health care provider.

## 2014-12-12 NOTE — ED Notes (Signed)
Pt. Arrived to ed from [facility] with reprots of concern of pt not being able to empty bladder. Pt' and pt's son in triage. Pt's son reports pt experienced a fall this AM at home and shortly afterwards starting experiencing the urge to urinate but was unable to do so. Pt reports since fall she has not been able to empty of bladder. Pt verbalized while waiting in waiting room she became incontinent and had a "very small amount of urine come out on me" per pt. Pt alert and oriented. Pt verbalized that she was able to urinate fine before fall.

## 2014-12-12 NOTE — ED Notes (Signed)
Bladder Scan performed, 979ML noted.

## 2014-12-12 NOTE — Discharge Instructions (Signed)
Back Pain, Adult Low back pain is very common. About 1 in 5 people have back pain.The cause of low back pain is rarely dangerous. The pain often gets better over time.About half of people with a sudden onset of back pain feel better in just 2 weeks. About 8 in 10 people feel better by 6 weeks.  CAUSES Some common causes of back pain include:  Strain of the muscles or ligaments supporting the spine.  Wear and tear (degeneration) of the spinal discs.  Arthritis.  Direct injury to the back. DIAGNOSIS Most of the time, the direct cause of low back pain is not known.However, back pain can be treated effectively even when the exact cause of the pain is unknown.Answering your caregiver's questions about your overall health and symptoms is one of the most accurate ways to make sure the cause of your pain is not dangerous. If your caregiver needs more information, he or she may order lab work or imaging tests (X-rays or MRIs).However, even if imaging tests show changes in your back, this usually does not require surgery. HOME CARE INSTRUCTIONS For many people, back pain returns.Since low back pain is rarely dangerous, it is often a condition that people can learn to [facility] their own.   Remain active. It is stressful on the back to sit or stand in one place. Do not sit, drive, or stand in one place for more than 30 minutes at a time. Take short walks on level surfaces as soon as pain allows.Try to increase the length of time you walk each day.  Do not stay in bed.Resting more than 1 or 2 days can delay your recovery.  Do not avoid exercise or work.Your body is made to move.It is not dangerous to be active, even though your back may hurt.Your back will likely heal faster if you return to being active before your pain is gone.  Pay attention to your body when you bend and lift. Many people have less discomfortwhen lifting if they bend their knees, keep the load close to their bodies,and  avoid twisting. Often, the most comfortable positions are those that put less stress on your recovering back.  Find a comfortable position to sleep. Use a firm mattress and lie on your side with your knees slightly bent. If you lie on your back, put a pillow under your knees.  Only take over-the-counter or prescription medicines as directed by your caregiver. Over-the-counter medicines to reduce pain and inflammation are often the most helpful.Your caregiver may prescribe muscle relaxant drugs.These medicines help dull your pain so you can more quickly return to your normal activities and healthy exercise.  Put ice on the injured area.  Put ice in a plastic bag.  Place a towel between your skin and the bag.  Leave the ice on for 15-20 minutes, 03-04 times a day for the first 2 to 3 days. After that, ice and heat may be alternated to reduce pain and spasms.  Ask your caregiver about trying back exercises and gentle massage. This may be of some benefit.  Avoid feeling anxious or stressed.Stress increases muscle tension and can worsen back pain.It is important to recognize when you are anxious or stressed and learn ways to manage it.Exercise is a great option. SEEK MEDICAL CARE IF:  You have pain that is not relieved with rest or medicine.  You have pain that does not improve in 1 week.  You have new symptoms.  You are generally not feeling well. SEEK  IMMEDIATE MEDICAL CARE IF:   You have pain that radiates from your back into your legs.  You develop new bowel or bladder control problems.  You have unusual weakness or numbness in your arms or legs.  You develop nausea or vomiting.  You develop abdominal pain.  You feel faint. Document Released: 07/12/2005 Document Revised: 01/11/2012 Document Reviewed: 11/13/2013 [facility] Patient Information 2015 [place], [place]. This information is not intended to replace advice given to you by your health care provider. Make sure you  discuss any questions you have with your health care provider.  Acute Urinary Retention Acute urinary retention is the temporary inability to urinate. This is an uncommon problem in women. It can be caused by:  Infection.  A side effect of a medicine.  A problem in a nearby organ that presses or squeezes on the bladder or the urethra (the tube that drains the bladder).  Psychological problems.   Surgery on your bladder, urethra, or pelvic organs that causes obstruction to the outflow of urine from your bladder. HOME CARE INSTRUCTIONS  If you are sent home with a Foley catheter and a drainage system, you will need to discuss the best course of action with your health care provider. While the catheter is in, maintain a good intake of fluids. Keep the drainage bag emptied and lower than your catheter. This is so that contaminated urine will not flow back into your bladder, which could lead to a urinary tract infection. There are two main types of drainage bags. One is a large bag that usually is used at night. It has a good capacity that will allow you to sleep through the night without having to empty it. The second type is called a leg bag. It has a smaller capacity so it needs to be emptied more frequently. However, the main advantage is that it can be attached by a leg strap and goes underneath your clothing, allowing you the freedom to move about or leave your home. Only take over-the-counter or prescription medicines for pain, discomfort, or fever as directed by your health care provider.  SEEK MEDICAL CARE IF:  You develop a low-grade fever.  You experience spasms or leakage of urine with the spasms. SEEK IMMEDIATE MEDICAL CARE IF:   You develop chills or fever.  Your catheter stops draining urine.  Your catheter falls out.  You start to develop increased bleeding that does not respond to rest and increased fluid intake. MAKE SURE YOU:  Understand these instructions.  Will  watch your condition.  Will get help right away if you are not doing well or get worse. Document Released: 07/11/2006 Document Revised: 05/02/2013 Document Reviewed: 12/21/2012 [facility] Patient Information 2015 [place], [place]. This information is not intended to replace advice given to you by your health care provider. Make sure you discuss any questions you have with your health care provider.
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
## 2014-12-12 NOTE — ED Notes (Signed)
[place] this morning in the kitchen on her tailbone. Has a history of vertigo and felt dizzy right before she fell. No loss of consciousness, no headache, no changes in vision.

## 2014-12-12 NOTE — ED Notes (Signed)
Pt states she fell this morning in her kitchen; denies LOC. Son states that pt has vertigo. Pt thinks she hit her coccyx and "maybe some other bones" and that she has been unable to urinate since then. After triage, pt was able to urinate "a little" but still feels intense pressure in lower abdominal area. Urine sample not collected but hat installed in toilet and pt to attempt urination again soon.

## 2014-12-12 NOTE — ED Notes (Signed)
Pt returned from mri

## 2014-12-17 ENCOUNTER — Emergency Department
Admission: EM | Admit: 2014-12-17 | Discharge: 2014-12-18 | Disposition: A | Discharge status: Home / Self Care | Attending: Emergency Medicine | Admitting: Emergency Medicine

## 2014-12-17 ENCOUNTER — Ambulatory Visit

## 2014-12-17 DIAGNOSIS — Z79899 Other long term (current) drug therapy: Secondary | ICD-10-CM | POA: Diagnosis not present

## 2014-12-17 DIAGNOSIS — B9689 Other specified bacterial agents as the cause of diseases classified elsewhere: Secondary | ICD-10-CM

## 2014-12-17 DIAGNOSIS — T839XXA Unspecified complication of genitourinary prosthetic device, implant and graft, initial encounter: Secondary | ICD-10-CM

## 2014-12-17 DIAGNOSIS — N760 Acute vaginitis: Secondary | ICD-10-CM | POA: Diagnosis not present

## 2014-12-17 DIAGNOSIS — T83098A Other mechanical complication of other indwelling urethral catheter, initial encounter: Secondary | ICD-10-CM | POA: Diagnosis not present

## 2014-12-17 DIAGNOSIS — Y846 Urinary catheterization as the cause of abnormal reaction of the patient, or of later complication, without mention of misadventure at the time of the procedure: Secondary | ICD-10-CM | POA: Insufficient documentation

## 2014-12-17 DIAGNOSIS — R509 Fever, unspecified: Secondary | ICD-10-CM | POA: Diagnosis present

## 2014-12-17 NOTE — ED Notes (Signed)
Pt had catheter placed Friday due to urinary retention, today she is complaining of fever and pain at catheter site.  Pt poor historian unable to assess output into the bag or trauma to the foley.

## 2014-12-17 NOTE — ED Notes (Signed)
Leg bag noted with about 73ml of dark colored urine, bag replaced to measure new output and collect fresh specimen.

## 2014-12-18 LAB — WET PREP, GENITAL
TRICH WET PREP: NONE SEEN
Yeast Wet Prep HPF POC: NONE SEEN

## 2014-12-18 LAB — URINALYSIS COMPLETE WITH MICROSCOPIC (ARMC ONLY)
Bilirubin Urine: NEGATIVE
Glucose, UA: NEGATIVE mg/dL
Ketones, ur: NEGATIVE mg/dL
Nitrite: NEGATIVE
Protein, ur: NEGATIVE mg/dL
Specific Gravity, Urine: 1.009 (ref 1.005–1.030)
Squamous Epithelial / LPF: NONE SEEN
pH: 6 (ref 5.0–8.0)

## 2014-12-18 MED ORDER — METRONIDAZOLE 500 MG PO TABS
ORAL_TABLET | ORAL | Status: AC
  Administered 2014-12-18: 500 mg via ORAL
  Filled 2014-12-18: qty 1

## 2014-12-18 MED ORDER — METRONIDAZOLE 500 MG PO TABS: 500.0000 mg | ORAL_TABLET | Freq: Two times a day (BID) | ORAL | Status: AC

## 2014-12-18 MED ORDER — OXYBUTYNIN CHLORIDE ER 10 MG PO TB24: 10.0000 mg | ORAL_TABLET | Freq: Every day | ORAL | Status: DC

## 2014-12-18 MED ORDER — METRONIDAZOLE 500 MG PO TABS
500.0000 mg | ORAL_TABLET | Freq: Once | ORAL | Status: AC
  Administered 2014-12-18: 500 mg via ORAL
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
## 2014-12-18 NOTE — Discharge Instructions (Signed)
Bacterial Vaginosis Bacterial vaginosis is an infection of the vagina. It happens when too many of certain germs (bacteria) grow in the vagina. HOME CARE  Take your medicine as told by your doctor.  Finish your medicine even if you start to feel better.  Do not have sex until you finish your medicine and are better.  Tell your sex partner that you have an infection. They should see their doctor for treatment.  Practice safe sex. Use condoms. Have only one sex partner. GET HELP IF:  You are not getting better after 3 days of treatment.  You have more grey fluid (discharge) coming from your vagina than before.  You have more pain than before.  You have a fever. MAKE SURE YOU:   Understand these instructions.  Will watch your condition.  Will get help right away if you are not doing well or get worse. Document Released: 04/20/2008 Document Revised: 05/02/2013 Document Reviewed: 02/21/2013 [facility] Patient Information 2015 [place], [place]. This information is not intended to replace advice given to you by your health care provider. Make sure you discuss any questions you have with your health care provider.  Vaginitis Vaginitis is an inflammation of the vagina. It is most often caused by a change in the normal balance of the bacteria and yeast that live in the vagina. This change in balance causes an overgrowth of certain bacteria or yeast, which causes the inflammation. There are different types of vaginitis, but the most common types are:  Bacterial vaginosis.  Yeast infection (candidiasis).  Trichomoniasis vaginitis. This is a sexually transmitted infection (STI).  Viral vaginitis.  Atropic vaginitis.  Allergic vaginitis. CAUSES  The cause depends on the type of vaginitis. Vaginitis can be caused by:  Bacteria (bacterial vaginosis).  Yeast (yeast infection).  A parasite (trichomoniasis vaginitis)  A virus (viral vaginitis).  Low hormone levels (atrophic  vaginitis). Low hormone levels can occur during pregnancy, breastfeeding, or after menopause.  Irritants, such as bubble baths, scented tampons, and feminine sprays (allergic vaginitis). Other factors can change the normal balance of the yeast and bacteria that live in the vagina. These include:  Antibiotic medicines.  Poor hygiene.  Diaphragms, vaginal sponges, spermicides, birth control pills, and intrauterine devices (IUD).  Sexual intercourse.  Infection.  Uncontrolled diabetes.  A weakened immune system. SYMPTOMS  Symptoms can vary depending on the cause of the vaginitis. Common symptoms include:  Abnormal vaginal discharge.  The discharge is white, gray, or yellow with bacterial vaginosis.  The discharge is thick, white, and cheesy with a yeast infection.  The discharge is frothy and yellow or greenish with trichomoniasis.  A bad vaginal odor.  The odor is fishy with bacterial vaginosis.  Vaginal itching, pain, or swelling.  Painful intercourse.  Pain or burning when urinating. Sometimes, there are no symptoms. TREATMENT  Treatment will vary depending on the type of infection.   Bacterial vaginosis and trichomoniasis are often treated with antibiotic creams or pills.  Yeast infections are often treated with antifungal medicines, such as vaginal creams or suppositories.  Viral vaginitis has no cure, but symptoms can be treated with medicines that relieve discomfort. Your sexual partner should be treated as well.  Atrophic vaginitis may be treated with an estrogen cream, pill, suppository, or vaginal ring. If vaginal dryness occurs, lubricants and moisturizing creams may help. You may be told to avoid scented soaps, sprays, or douches.  Allergic vaginitis treatment involves quitting the use of the product that is causing the problem. Vaginal creams  can be used to treat the symptoms. HOME CARE INSTRUCTIONS   Take all medicines as directed by your  caregiver.  Keep your genital area clean and dry. Avoid soap and only rinse the area with water.  Avoid douching. It can remove the healthy bacteria in the vagina.  Do not use tampons or have sexual intercourse until your vaginitis has been treated. Use sanitary pads while you have vaginitis.  Wipe from front to back. This avoids the spread of bacteria from the rectum to the vagina.  Let air reach your genital area.  Wear cotton underwear to decrease moisture buildup.  Avoid wearing underwear while you sleep until your vaginitis is gone.  Avoid tight pants and underwear or nylons without a cotton panel.  Take off wet clothing (especially bathing suits) as soon as possible.  Use mild, non-scented products. Avoid using irritants, such as:  Scented feminine sprays.  Fabric softeners.  Scented detergents.  Scented tampons.  Scented soaps or bubble baths.  Practice safe sex and use condoms. Condoms may prevent the spread of trichomoniasis and viral vaginitis. SEEK MEDICAL CARE IF:   You have abdominal pain.  You have a fever or persistent symptoms for more than 2-3 days.  You have a fever and your symptoms suddenly get worse. Document Released: 05/09/2007 Document Revised: 04/05/2012 Document Reviewed: 12/23/2011 [facility] Patient Information 2015 [place], [place]. This information is not intended to replace advice given to you by your health care provider. Make sure you discuss any questions you have with your health care provider.

## 2014-12-18 NOTE — ED Notes (Signed)
Bladder scan results 83ml

## 2014-12-20 ENCOUNTER — Telehealth: Payer: Self-pay | Admitting: Internal Medicine

## 2014-12-20 ENCOUNTER — Encounter: Payer: Self-pay | Admitting: *Deleted

## 2014-12-20 NOTE — Telephone Encounter (Signed)
Melinda [name] called to make pt appt to discuss some mental status changes and home healthcare. Please advise where to add to schedule/msn

## 2014-12-20 NOTE — Telephone Encounter (Signed)
Need more info.  She was recently in hospital.  Did they arrange home health?  What specific problems is she having?

## 2014-12-20 NOTE — Telephone Encounter (Signed)
No voicemail available. Sent mychart message

## 2014-12-23 NOTE — Telephone Encounter (Signed)
My chart message sent to pt to inform to come in at 12:15 12/24/14.

## 2014-12-24 NOTE — Telephone Encounter (Signed)
Son has contacted Dr. Nicki Reaper via [place] over the weekend.

## 2015-01-11 ENCOUNTER — Ambulatory Visit
Admission: EM | Admit: 2015-01-11 | Discharge: 2015-01-11 | Disposition: A | Discharge status: Home / Self Care | Attending: Internal Medicine | Admitting: Internal Medicine

## 2015-01-11 ENCOUNTER — Encounter: Payer: Self-pay | Admitting: Gynecology

## 2015-01-11 ENCOUNTER — Ambulatory Visit

## 2015-01-11 DIAGNOSIS — R339 Retention of urine, unspecified: Secondary | ICD-10-CM | POA: Insufficient documentation

## 2015-01-11 DIAGNOSIS — M1990 Unspecified osteoarthritis, unspecified site: Secondary | ICD-10-CM | POA: Diagnosis not present

## 2015-01-11 DIAGNOSIS — W228XXA Striking against or struck by other objects, initial encounter: Secondary | ICD-10-CM | POA: Insufficient documentation

## 2015-01-11 DIAGNOSIS — M25512 Pain in left shoulder: Secondary | ICD-10-CM

## 2015-01-11 DIAGNOSIS — Z79899 Other long term (current) drug therapy: Secondary | ICD-10-CM | POA: Diagnosis not present

## 2015-01-11 DIAGNOSIS — R42 Dizziness and giddiness: Secondary | ICD-10-CM | POA: Insufficient documentation

## 2015-01-11 DIAGNOSIS — Y939 Activity, unspecified: Secondary | ICD-10-CM | POA: Insufficient documentation

## 2015-01-11 DIAGNOSIS — G4730 Sleep apnea, unspecified: Secondary | ICD-10-CM | POA: Insufficient documentation

## 2015-01-11 DIAGNOSIS — S5002XA Contusion of left elbow, initial encounter: Secondary | ICD-10-CM | POA: Diagnosis not present

## 2015-01-11 DIAGNOSIS — E780 Pure hypercholesterolemia: Secondary | ICD-10-CM | POA: Insufficient documentation

## 2015-01-11 DIAGNOSIS — Y92009 Unspecified place in unspecified non-institutional (private) residence as the place of occurrence of the external cause: Secondary | ICD-10-CM | POA: Insufficient documentation

## 2015-01-11 DIAGNOSIS — S4992XA Unspecified injury of left shoulder and upper arm, initial encounter: Secondary | ICD-10-CM | POA: Diagnosis not present

## 2015-01-11 HISTORY — DX: Retention of urine, unspecified: R33.9
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
## 2015-01-11 NOTE — ED Notes (Signed)
Call to patient's husband to come pick up patient.

## 2015-01-11 NOTE — Discharge Instructions (Signed)
As we discussed the xrays today are negative for broken bones.The left elbow is working well and minimally tender with a small scrape. The left shoulder is moving better than it did on arrival, but at the time of discharge was not able to lift itself  (As if to raise your hand in class) ) or to do the chicken wing exercise we practiced. I would like you to have it re-examine after a day of rest to see if the mobility has improved. As we also discussed- it is always of concern that any part of the body won't do what the brain asks it to do. Melinda [name] has had some difficulty with memory issues, balance and falling recently.  We need to continually be aware that she could have internal difficulties in the brain that cause these problems to get worse.Her fall this morning was not witnessed but she insists she did not hit any part of her head or neck. Her exam does not reveal any are of recent trauma.   If she has any additional falls, any change in her behavior or personality, or any complaint of headache or visual changes she needs to be seen in the Emergency Room of your Choice. Thank you for choosing [place] for your care today..       Shoulder Pain The shoulder is the joint that connects your arms to your body. The bones that form the shoulder joint include the upper arm bone (humerus), the shoulder blade (scapula), and the collarbone (clavicle). The top of the humerus is shaped like a ball and fits into a rather flat socket on the scapula (glenoid cavity). A combination of muscles and strong, fibrous tissues that connect muscles to bones (tendons) support your shoulder joint and hold the ball in the socket. Small, fluid-filled sacs (bursae) are located in different areas of the joint. They act as cushions between the bones and the overlying soft tissues and help reduce friction between the gliding tendons and the bone as you move your arm. Your shoulder joint allows a wide range of motion in your arm. This  range of motion allows you to do things like scratch your back or throw a ball. However, this range of motion also makes your shoulder more prone to pain from overuse and injury. Causes of shoulder pain can originate from both injury and overuse and usually can be grouped in the following four categories:  Redness, swelling, and pain (inflammation) of the tendon (tendinitis) or the bursae (bursitis).  Instability, such as a dislocation of the joint.  Inflammation of the joint (arthritis).  Broken bone (fracture). HOME CARE INSTRUCTIONS   Apply ice to the sore area.  Put ice in a plastic bag.  Place a towel between your skin and the bag.  Leave the ice on for 15-20 minutes, 3-4 times per day for the first 2 days, or as directed by your health care provider.  Stop using cold packs if they do not help with the pain.  If you have a shoulder sling or immobilizer, wear it as long as your caregiver instructs. Only remove it to shower or bathe. Move your arm as little as possible, but keep your hand moving to prevent swelling.  Squeeze a soft ball or foam pad as much as possible to help prevent swelling.  Only take over-the-counter or prescription medicines for pain, discomfort, or fever as directed by your caregiver. SEEK MEDICAL CARE IF:   Your shoulder pain increases, or new pain develops  in your arm, hand, or fingers.  Your hand or fingers become cold and numb.  Your pain is not relieved with medicines. SEEK IMMEDIATE MEDICAL CARE IF:   Your arm, hand, or fingers are numb or tingling.  Your arm, hand, or fingers are significantly swollen or turn white or blue. MAKE SURE YOU:   Understand these instructions.  Will watch your condition.  Will get help right away if you are not doing well or get worse. Document Released: 04/21/2005 Document Revised: 11/26/2013 Document Reviewed: 06/26/2011 [facility] Patient Information 2015 [place], [place]. This information is not intended to  replace advice given to you by your health care provider. Make sure you discuss any questions you have with your health care provider. Fall Prevention and Home Safety Falls cause injuries and can affect all age groups. It is possible to use preventive measures to significantly decrease the likelihood of falls. There are many simple measures which can make your home safer and prevent falls. OUTDOORS  Repair cracks and edges of walkways and driveways.  Remove high doorway thresholds.  Trim shrubbery on the main path into your home.  Have good outside lighting.  Clear walkways of tools, rocks, debris, and clutter.  Check that handrails are not broken and are securely fastened. Both sides of steps should have handrails.  Have leaves, snow, and ice cleared regularly.  Use sand or salt on walkways during winter months.  In the garage, clean up grease or oil spills. BATHROOM  Install night lights.  Install grab bars by the toilet and in the tub and shower.  Use non-skid mats or decals in the tub or shower.  Place a plastic non-slip stool in the shower to sit on, if needed.  Keep floors dry and clean up all water on the floor immediately.  Remove soap buildup in the tub or shower on a regular basis.  Secure bath mats with non-slip, double-sided rug tape.  Remove throw rugs and tripping hazards from the floors. BEDROOMS  Install night lights.  Make sure a bedside light is easy to reach.  Do not use oversized bedding.  Keep a telephone by your bedside.  Have a firm chair with side arms to use for getting dressed.  Remove throw rugs and tripping hazards from the floor. KITCHEN  Keep handles on pots and pans turned toward the center of the stove. Use back burners when possible.  Clean up spills quickly and allow time for drying.  Avoid walking on wet floors.  Avoid hot utensils and knives.  Position shelves so they are not too high or low.  Place commonly used  objects within easy reach.  If necessary, use a sturdy step stool with a grab bar when reaching.  Keep electrical cables out of the way.  Do not use floor polish or wax that makes floors slippery. If you must use wax, use non-skid floor wax.  Remove throw rugs and tripping hazards from the floor. STAIRWAYS  Never leave objects on stairs.  Place handrails on both sides of stairways and use them. Fix any loose handrails. Make sure handrails on both sides of the stairways are as long as the stairs.  Check carpeting to make sure it is firmly attached along stairs. Make repairs to worn or loose carpet promptly.  Avoid placing throw rugs at the top or bottom of stairways, or properly secure the rug with carpet tape to prevent slippage. Get rid of throw rugs, if possible.  Have an electrician put in  a light switch at the top and bottom of the stairs. OTHER FALL PREVENTION TIPS  Wear low-heel or rubber-soled shoes that are supportive and fit well. Wear closed toe shoes.  When using a stepladder, make sure it is fully opened and both spreaders are firmly locked. Do not climb a closed stepladder.  Add color or contrast paint or tape to grab bars and handrails in your home. Place contrasting color strips on first and last steps.  Learn and use mobility aids as needed. Install an electrical emergency response system.  Turn on lights to avoid dark areas. Replace light bulbs that burn out immediately. Get light switches that glow.  Arrange furniture to create clear pathways. Keep furniture in the same place.  Firmly attach carpet with non-skid or double-sided tape.  Eliminate uneven floor surfaces.  Select a carpet pattern that does not visually hide the edge of steps.  Be aware of all pets. OTHER HOME SAFETY TIPS  Set the water temperature for 120 F (48.8 C).  Keep emergency numbers on or near the telephone.  Keep smoke detectors on every level of the home and near sleeping  areas. Document Released: 07/02/2002 Document Revised: 01/11/2012 Document Reviewed: 10/01/2011 [facility] Patient Information 2015 [place], [place]. This information is not intended to replace advice given to you by your health care provider. Make sure you discuss any questions you have with your health care provider.

## 2015-01-11 NOTE — ED Notes (Signed)
Patient stated she fell over the dog bowl and landed on her left shoulder.  ROM is limited.

## 2015-01-12 ENCOUNTER — Encounter: Payer: Self-pay | Admitting: Physician Assistant

## 2015-01-21 ENCOUNTER — Ambulatory Visit: Payer: Self-pay | Admitting: Urology

## 2015-02-04 ENCOUNTER — Ambulatory Visit

## 2015-11-17 ENCOUNTER — Emergency Department

## 2015-11-17 ENCOUNTER — Emergency Department
Admission: EM | Admit: 2015-11-17 | Discharge: 2015-11-20 | Disposition: A | Discharge status: Other Acute Inpatient Hospital | Attending: Student | Admitting: Student

## 2015-11-17 DIAGNOSIS — F03918 Unspecified dementia, unspecified severity, with other behavioral disturbance: Secondary | ICD-10-CM

## 2015-11-17 DIAGNOSIS — F0391 Unspecified dementia with behavioral disturbance: Secondary | ICD-10-CM

## 2015-11-17 DIAGNOSIS — F02818 Dementia in other diseases classified elsewhere, unspecified severity, with other behavioral disturbance: Secondary | ICD-10-CM

## 2015-11-17 DIAGNOSIS — F0281 Dementia in other diseases classified elsewhere with behavioral disturbance: Secondary | ICD-10-CM

## 2015-11-17 DIAGNOSIS — G3109 Other frontotemporal dementia: Secondary | ICD-10-CM | POA: Diagnosis not present

## 2015-11-17 DIAGNOSIS — M1990 Unspecified osteoarthritis, unspecified site: Secondary | ICD-10-CM | POA: Diagnosis not present

## 2015-11-17 HISTORY — DX: Unspecified dementia, unspecified severity, without behavioral disturbance, psychotic disturbance, mood disturbance, and anxiety: F03.90

## 2015-11-17 LAB — COMPREHENSIVE METABOLIC PANEL
ALBUMIN: 4.1 g/dL (ref 3.5–5.0)
ALT: 17 U/L (ref 14–54)
AST: 20 U/L (ref 15–41)
Alkaline Phosphatase: 91 U/L (ref 38–126)
Anion gap: 7 (ref 5–15)
BUN: 26 mg/dL — AB (ref 6–20)
CO2: 30 mmol/L (ref 22–32)
CREATININE: 0.59 mg/dL (ref 0.44–1.00)
Calcium: 9.5 mg/dL (ref 8.9–10.3)
Chloride: 104 mmol/L (ref 101–111)
GFR calc Af Amer: >60 mL/min (ref >60)
GFR calc non Af Amer: >60 mL/min (ref >60)
GLUCOSE: 92 mg/dL (ref 65–99)
Potassium: 4 mmol/L (ref 3.5–5.1)
SODIUM: 141 mmol/L (ref 135–145)
Total Bilirubin: 0.3 mg/dL (ref 0.3–1.2)
Total Protein: 7.6 g/dL (ref 6.5–8.1)

## 2015-11-17 LAB — CBC WITH DIFFERENTIAL/PLATELET
BASOS PCT: 1 %
Basophils Absolute: 0 10*3/uL (ref 0–0.1)
EOS ABS: 0.2 10*3/uL (ref 0–0.7)
Eosinophils Relative: 2 %
HEMATOCRIT: 37.1 % (ref 35.0–47.0)
HEMOGLOBIN: 12.3 g/dL (ref 12.0–16.0)
Lymphocytes Relative: 41 %
Lymphs Abs: 3.6 10*3/uL (ref 1.0–3.6)
MCH: 29.7 pg (ref 26.0–34.0)
MCHC: 33.3 g/dL (ref 32.0–36.0)
MCV: 89.3 fL (ref 80.0–100.0)
Monocytes Absolute: 1 10*3/uL — ABNORMAL HIGH (ref 0.2–0.9)
Monocytes Relative: 11 %
NEUTROS ABS: 4 10*3/uL (ref 1.4–6.5)
NEUTROS PCT: 45 %
Platelets: 391 10*3/uL (ref 150–440)
RBC: 4.15 MIL/uL (ref 3.80–5.20)
RDW: 14.5 % (ref 11.5–14.5)
WBC: 8.7 10*3/uL (ref 3.6–11.0)

## 2015-11-17 LAB — ETHANOL: Alcohol, Ethyl (B): <5 mg/dL (ref <5)

## 2015-11-17 LAB — SALICYLATE LEVEL

## 2015-11-17 LAB — ACETAMINOPHEN LEVEL

## 2015-11-17 LAB — VALPROIC ACID LEVEL: Valproic Acid Lvl: 44 ug/mL — ABNORMAL LOW (ref 50.0–100.0)

## 2015-11-17 MED ORDER — LORAZEPAM 1 MG PO TABS
1.0000 mg | ORAL_TABLET | Freq: Once | ORAL | Status: AC
  Administered 2015-11-17: 1 mg via ORAL
  Filled 2015-11-17: qty 1

## 2015-11-17 MED ORDER — HALOPERIDOL LACTATE 5 MG/ML IJ SOLN
INTRAMUSCULAR | Status: AC
  Administered 2015-11-17: 2.5 mg via INTRAMUSCULAR
  Filled 2015-11-17: qty 1

## 2015-11-17 MED ORDER — DIPHENHYDRAMINE HCL 50 MG/ML IJ SOLN
12.5000 mg | Freq: Once | INTRAMUSCULAR | Status: AC
  Administered 2015-11-17: 12.5 mg via INTRAVENOUS

## 2015-11-17 MED ORDER — HALOPERIDOL 0.5 MG PO TABS: 0.5000 mg | ORAL_TABLET | Freq: Once | ORAL | Status: DC

## 2015-11-17 MED ORDER — DIPHENHYDRAMINE HCL 50 MG/ML IJ SOLN
INTRAMUSCULAR | Status: AC
  Administered 2015-11-17: 12.5 mg via INTRAVENOUS
  Filled 2015-11-17: qty 1

## 2015-11-17 MED ORDER — HALOPERIDOL LACTATE 5 MG/ML IJ SOLN
2.5000 mg | Freq: Once | INTRAMUSCULAR | Status: AC
  Administered 2015-11-17: 2.5 mg via INTRAMUSCULAR

## 2015-11-17 MED ORDER — DIPHENHYDRAMINE HCL 25 MG PO CAPS
25.0000 mg | ORAL_CAPSULE | Freq: Once | ORAL | Status: DC
  Filled 2015-11-17: qty 1
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
## 2015-11-17 NOTE — ED Notes (Signed)
Pt agitates, swinging at staff.

## 2015-11-17 NOTE — ED Notes (Signed)
Pt resting comfortably in ED stretcher with family at bedside. No complaints or concerns voiced at this time.
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
## 2015-11-17 NOTE — ED Notes (Addendum)
Pt is becoming more agitated with staff - She is talking to people that are not there and answering them - her comments to the auditory/visual hallucinations are hostile in nature - charge nurse notified - This Probation officer gave pt a boxed meal that she ate partially - pt is disoriented to place and time

## 2015-11-17 NOTE — ED Notes (Signed)
Per ems dx with dementia 6 months ago and the family states today that she has been threatening others and they want her to be worked up for psych eval
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
## 2015-11-18 DIAGNOSIS — F0391 Unspecified dementia with behavioral disturbance: Secondary | ICD-10-CM | POA: Diagnosis not present

## 2015-11-18 DIAGNOSIS — F03918 Unspecified dementia, unspecified severity, with other behavioral disturbance: Secondary | ICD-10-CM

## 2015-11-18 MED ORDER — DIVALPROEX SODIUM 125 MG PO CSDR
125.0000 mg | DELAYED_RELEASE_CAPSULE | Freq: Three times a day (TID) | ORAL | Status: DC
  Administered 2015-11-18 – 2015-11-20 (×5): 125 mg via ORAL
  Filled 2015-11-18 (×11): qty 1

## 2015-11-18 MED ORDER — SERTRALINE HCL 50 MG PO TABS
50.0000 mg | ORAL_TABLET | Freq: Every day | ORAL | Status: DC
  Administered 2015-11-18 – 2015-11-20 (×3): 50 mg via ORAL
  Filled 2015-11-18 (×3): qty 1

## 2015-11-18 MED ORDER — QUETIAPINE FUMARATE 25 MG PO TABS
25.0000 mg | ORAL_TABLET | Freq: Four times a day (QID) | ORAL | Status: DC | PRN
  Administered 2015-11-20: 25 mg via ORAL
  Filled 2015-11-18: qty 1
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
## 2015-11-18 NOTE — ED Notes (Signed)

## 2015-11-18 NOTE — ED Notes (Signed)
BEHAVIORAL HEALTH ROUNDING Patient sleeping: No. Patient alert and oriented: yes Behavior appropriate: Yes.  ; If no, describe:  Nutrition and fluids offered: yes Toileting and hygiene offered: Yes  Sitter present: q15 minute observations and security  monitoring Law enforcement present: Yes  ODS  

## 2015-11-18 NOTE — ED Notes (Signed)
Assessment completed She denies pain   Melinda [name] and I have changed her and her linens  - repositioned her in bed

## 2015-11-18 NOTE — ED Notes (Signed)
Son has been at the bedside for a visit - questions answered  Pt observed with no unusual behavior  Appropriate to stimulation  No verbalized needs or concerns at this time  NAD assessed  Continue to monitor

## 2015-11-18 NOTE — Progress Notes (Signed)
Dr. Weber Cooks recommended psychiatric Inpatient admission when medically cleared (consult on 11/18/15).  Patient has been referred to geriatric inpatient psych treatment at: [facility] - per [facility]. [facility] - per [place], accepting referrals for the waitlist. [facility] - per [place], adult beds open. [facility] - per [facility], accepting referrals. [facility] - per [facility], send it. [facility] - per [facility], accepting referrals for geriatric unit. [facility] per [place], accepting referrals. [facility] - per [place], beds open.  [facility] - left voicemail inquiring about bed status.   At capacity: [facility] both adult unit and geriatric [facility] [facility] - no answer.  Verlon Setting, [place] Disposition staff 11/18/2015 8:26 PM

## 2015-11-18 NOTE — ED Notes (Signed)
Patient observed lying in bed with eyes closed  Even, unlabored respirations observed   NAD pt appears to be sleeping  I will continue to monitor along with every 15 minute visual observations and ongoing security monitoring    
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
## 2015-11-18 NOTE — ED Notes (Signed)
ED BHU [place] Is the patient under IVC or is there intent for IVC: Yes.   Is the patient medically cleared: Yes.   Is there vacancy in the ED BHU: Yes.   Is the population mix appropriate for patient:  - No-  Pt cannot walk without assistance  Is the patient awaiting placement in inpatient or outpatient setting: family reports that they can no longer take care of her    Has the patient had a psychiatric consult:  Pending  Survey of unit performed for contraband, proper placement and condition of furniture, tampering with fixtures in bathroom, shower, and each patient room: Yes.  ; Findings:  APPEARANCE/BEHAVIOR Calm and cooperative NEURO ASSESSMENT Orientation: oriented to self only    Denies pain Hallucinations: No.None noted (Hallucinations) Speech: Normal - slow to respond  Gait: unsteady  - assistance provided   RESPIRATORY ASSESSMENT Even  Unlabored respirations  CARDIOVASCULAR ASSESSMENT Pulses equal   regular rate  Skin warm and dry   GASTROINTESTINAL ASSESSMENT no GI complaint EXTREMITIES Full ROM  PLAN OF CARE Provide calm/safe environment. Vital signs assessed twice daily. ED BHU Assessment once each 12-hour shift. Collaborate with intake RN daily or as condition indicates. Assure the ED provider has rounded once each shift. Provide and encourage hygiene. Provide redirection as needed. Assess for escalating behavior; address immediately and inform ED provider.  Assess family dynamic and appropriateness for visitation as needed: Yes.  ; If necessary, describe findings:  Educate the patient/family about BHU procedures/visitation: Yes.  ; If necessary, describe findings:

## 2015-11-18 NOTE — ED Notes (Signed)
Changed patient pamper and got patient warm blanket

## 2015-11-18 NOTE — ED Notes (Signed)
BEHAVIORAL HEALTH ROUNDING Patient sleeping: Yes.   Patient alert and oriented: eyes closed  Appears asleep Behavior appropriate: Yes.  ; If no, describe:  Nutrition and fluids offered: Yes  Toileting and hygiene offered: sleeping Sitter present: q 15 minute observations and security monitoring Law enforcement present: yes  ODS 

## 2015-11-19 ENCOUNTER — Emergency Department

## 2015-11-19 ENCOUNTER — Other Ambulatory Visit: Payer: Self-pay

## 2015-11-19 NOTE — ED Provider Notes (Signed)
-----------------------------------------   6:54 AM on 11/19/2015 -----------------------------------------   Blood pressure 121/67, pulse 75, temperature 98 F (36.7 C), temperature source Oral, resp. rate 18, height 5\' 10"  (1.778 m), weight 95.255 kg, SpO2 97 %.  The patient had no acute events since last update.  Calm and cooperative at this time.  Disposition is pending per Psychiatry/Behavioral Medicine team recommendations.  Hinda Kehr, MD 11/19/15 [phone]

## 2015-11-19 NOTE — BH Assessment (Signed)
Received phone call from [facility] ([phone]) requesting EKG and Chest X-Ray. Items were faxed to them.  Patient is pending bed offered. [facility] is awaiting approval from their admitting physician.

## 2015-11-19 NOTE — BH Assessment (Signed)
Referral information for Geriatric Placement have been faxed to;    [facility]([phone]),    [phone]),    [facility] [phone])   [facility] [phone]),    [facility] [phone]),    [facility] [phone]).
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
## 2015-11-19 NOTE — ED Notes (Signed)
Pt laying in stretcher, head and knees propped. Pt occasionally yelling out words. Care channel on tv for comfort.

## 2015-11-19 NOTE — ED Notes (Signed)
Pt diaper changed and bed linen changed. Floor swept from where pt had picked at diaper and thrown pieces of diaper on floor. Yellow grippy socks placed back on pt's feet. Pt informed not to pick at diaper or take socks off.

## 2015-11-20 NOTE — ED Notes (Signed)
Breakfast tray placed at bedside. Pt sleeping 

## 2015-11-20 NOTE — ED Provider Notes (Signed)
-----------------------------------------   8:06 AM on 11/20/2015 -----------------------------------------   Blood pressure 129/67, pulse 70, temperature 98 F (36.7 C), temperature source Oral, resp. rate 18, height 5\' 10"  (1.778 m), weight 210 lb (95.255 kg), SpO2 100 %.  The patient had no acute events since last update.  Calm and cooperative at this time.  Disposition is pending per Psychiatry/Behavioral Medicine team recommendations.     Carrie Mew, MD 11/20/15 [phone]

## 2015-11-20 NOTE — ED Notes (Signed)
BEHAVIORAL HEALTH ROUNDING Patient sleeping: No. Patient alert and oriented: yes Behavior appropriate: Yes.  ; If no, describe:  Nutrition and fluids offered: yes Toileting and hygiene offered: Yes  Sitter present: q15 minute observations and security  monitoring Law enforcement present: Yes  ODS  

## 2015-11-20 NOTE — ED Notes (Signed)
Pt linens and clothes changed. Floor in pt room swept. All trash removed from pt room. Pt given lunch tray and is eating lunch.

## 2015-11-20 NOTE — Progress Notes (Signed)
Pt potentially accepted to [facility] . TTS has faxed over IVC paperwork as requested.  11/20/2015 Con Memos, MS, [place], LPCA Therapeutic Triage Specialist

## 2015-11-20 NOTE — Progress Notes (Signed)
Patient has been accepted to [facility].  Patient assigned to room 412 Accepting physician is Dr. Morrell Riddle.  Call report to [phone].  Representative was [place].  ER Staff is aware of it [facility] Dr. Joni Fears, ER MD & Amy  Patient's Nurse)    Patient's Family/Support System (Son-HPOA) have been updated as well.  11/20/2015 Con Memos, MS, [place], LPCA Therapeutic Triage Specialist

## 2015-11-20 NOTE — ED Notes (Signed)
BEHAVIORAL HEALTH ROUNDING Patient sleeping: Yes.   Patient alert and oriented: eyes closed  Appears to be asleep Behavior appropriate: Yes.  ; If no, describe:  Nutrition and fluids offered:  Toileting and hygiene offered: sleeping Sitter present: q 15 minute observations and security monitoring Law enforcement present: yes  ODS

## 2015-11-20 NOTE — ED Notes (Signed)
Patient's linen,diaper and clothes changed. Floor swept from pieces of the diaper and chucks. Two warm blankets given.

## 2015-11-20 NOTE — ED Notes (Signed)
ED BHU [place] Is the patient under IVC or is there intent for IVC: Yes.   Is the patient medically cleared: Yes.   Is there vacancy in the ED BHU:   No  Is the population mix appropriate for patient:    Is the patient awaiting placement in inpatient or outpatient setting: Yes.  geripsych placement  Has the patient had a psychiatric consult: Yes.   Survey of unit performed for contraband, proper placement and condition of furniture, tampering with fixtures in bathroom, shower, and each patient room: Yes.  ; Findings:  APPEARANCE/BEHAVIOR Calm and cooperative NEURO ASSESSMENT Orientation: oriented to self Denies pain Hallucinations: No.None noted (Hallucinations) Speech: Normal Gait: unsteady  - 2 person assist  RESPIRATORY ASSESSMENT Even  Unlabored respirations  CARDIOVASCULAR ASSESSMENT Pulses equal   regular rate  Skin warm and dry   GASTROINTESTINAL ASSESSMENT no GI complaint EXTREMITIES Full ROM  PLAN OF CARE Provide calm/safe environment. Vital signs assessed twice daily. ED BHU Assessment once each 12-hour shift. Collaborate with intake RN daily or as condition indicates. Assure the ED provider has rounded once each shift. Provide and encourage hygiene. Provide redirection as needed. Assess for escalating behavior; address immediately and inform ED provider.  Assess family dynamic and appropriateness for visitation as needed: Yes.  ; If necessary, describe findings:  Educate the patient/family about BHU procedures/visitation: Yes.  ; If necessary, describe findings:

## 2015-11-20 NOTE — ED Notes (Signed)
Report called to Shirlee Limerick at [facility]  - pt to transfer at this time with [facility]

## 2015-11-20 NOTE — ED Notes (Signed)
Patient observed lying in bed with eyes closed  Even, unlabored respirations observed   NAD pt appears to be sleeping  I will continue to monitor along with every 15 minute visual observations and ongoing security monitoring    

## 2015-11-20 NOTE — ED Notes (Signed)

## 2016-01-14 ENCOUNTER — Emergency Department

## 2016-01-14 ENCOUNTER — Encounter: Payer: Self-pay | Admitting: *Deleted

## 2016-01-14 ENCOUNTER — Inpatient Hospital Stay
Admission: EM | Admit: 2016-01-14 | Discharge: 2016-01-19 | DRG: 689 | Disposition: A | Discharge status: Skilled Nursing Facility | Attending: Internal Medicine | Admitting: Internal Medicine

## 2016-01-14 ENCOUNTER — Other Ambulatory Visit: Payer: Self-pay

## 2016-01-14 DIAGNOSIS — E876 Hypokalemia: Secondary | ICD-10-CM | POA: Diagnosis present

## 2016-01-14 DIAGNOSIS — G9341 Metabolic encephalopathy: Secondary | ICD-10-CM | POA: Diagnosis present

## 2016-01-14 DIAGNOSIS — D649 Anemia, unspecified: Secondary | ICD-10-CM | POA: Diagnosis present

## 2016-01-14 DIAGNOSIS — F0281 Dementia in other diseases classified elsewhere with behavioral disturbance: Secondary | ICD-10-CM | POA: Diagnosis present

## 2016-01-14 DIAGNOSIS — G3109 Other frontotemporal dementia: Secondary | ICD-10-CM | POA: Diagnosis present

## 2016-01-14 DIAGNOSIS — F419 Anxiety disorder, unspecified: Secondary | ICD-10-CM | POA: Diagnosis present

## 2016-01-14 DIAGNOSIS — A419 Sepsis, unspecified organism: Secondary | ICD-10-CM | POA: Diagnosis not present

## 2016-01-14 DIAGNOSIS — N390 Urinary tract infection, site not specified: Secondary | ICD-10-CM | POA: Diagnosis present

## 2016-01-14 DIAGNOSIS — F0390 Unspecified dementia without behavioral disturbance: Secondary | ICD-10-CM | POA: Diagnosis not present

## 2016-01-14 DIAGNOSIS — Z8249 Family history of ischemic heart disease and other diseases of the circulatory system: Secondary | ICD-10-CM

## 2016-01-14 DIAGNOSIS — R410 Disorientation, unspecified: Secondary | ICD-10-CM | POA: Insufficient documentation

## 2016-01-14 DIAGNOSIS — Z881 Allergy status to other antibiotic agents status: Secondary | ICD-10-CM | POA: Diagnosis not present

## 2016-01-14 DIAGNOSIS — E785 Hyperlipidemia, unspecified: Secondary | ICD-10-CM | POA: Diagnosis present

## 2016-01-14 DIAGNOSIS — G4733 Obstructive sleep apnea (adult) (pediatric): Secondary | ICD-10-CM | POA: Diagnosis present

## 2016-01-14 DIAGNOSIS — M1990 Unspecified osteoarthritis, unspecified site: Secondary | ICD-10-CM | POA: Diagnosis present

## 2016-01-14 DIAGNOSIS — Z9081 Acquired absence of spleen: Secondary | ICD-10-CM

## 2016-01-14 DIAGNOSIS — E7800 Pure hypercholesterolemia, unspecified: Secondary | ICD-10-CM | POA: Diagnosis present

## 2016-01-14 DIAGNOSIS — Z79899 Other long term (current) drug therapy: Secondary | ICD-10-CM

## 2016-01-14 DIAGNOSIS — Z7401 Bed confinement status: Secondary | ICD-10-CM

## 2016-01-14 DIAGNOSIS — G629 Polyneuropathy, unspecified: Secondary | ICD-10-CM | POA: Diagnosis present

## 2016-01-14 DIAGNOSIS — B964 Proteus (mirabilis) (morganii) as the cause of diseases classified elsewhere: Secondary | ICD-10-CM | POA: Diagnosis present

## 2016-01-14 LAB — COMPREHENSIVE METABOLIC PANEL
ALT: 34 U/L (ref 14–54)
AST: 28 U/L (ref 15–41)
Albumin: 3.7 g/dL (ref 3.5–5.0)
Alkaline Phosphatase: 86 U/L (ref 38–126)
Anion gap: 8 (ref 5–15)
BILIRUBIN TOTAL: 0.4 mg/dL (ref 0.3–1.2)
BUN: 23 mg/dL — AB (ref 6–20)
CALCIUM: 9.6 mg/dL (ref 8.9–10.3)
CO2: 29 mmol/L (ref 22–32)
CREATININE: 0.77 mg/dL (ref 0.44–1.00)
Chloride: 107 mmol/L (ref 101–111)
GFR calc Af Amer: >60 mL/min (ref >60)
Glucose, Bld: 123 mg/dL — ABNORMAL HIGH (ref 65–99)
Potassium: 3.4 mmol/L — ABNORMAL LOW (ref 3.5–5.1)
Sodium: 144 mmol/L (ref 135–145)
TOTAL PROTEIN: 8.1 g/dL (ref 6.5–8.1)

## 2016-01-14 LAB — URINALYSIS COMPLETE WITH MICROSCOPIC (ARMC ONLY)
Bacteria, UA: NONE SEEN
Bilirubin Urine: NEGATIVE
GLUCOSE, UA: NEGATIVE mg/dL
Ketones, ur: NEGATIVE mg/dL
Nitrite: NEGATIVE
Protein, ur: 100 mg/dL — AB
Specific Gravity, Urine: 1.019 (ref 1.005–1.030)
pH: 6 (ref 5.0–8.0)

## 2016-01-14 LAB — CBC WITH DIFFERENTIAL/PLATELET
Basophils Absolute: 0 10*3/uL (ref 0–0.1)
Basophils Relative: 1 %
EOS ABS: 0.1 10*3/uL (ref 0–0.7)
EOS PCT: 1 %
HCT: 39.7 % (ref 35.0–47.0)
Hemoglobin: 13.3 g/dL (ref 12.0–16.0)
LYMPHS ABS: 2.4 10*3/uL (ref 1.0–3.6)
Lymphocytes Relative: 25 %
MCH: 30.1 pg (ref 26.0–34.0)
MCHC: 33.4 g/dL (ref 32.0–36.0)
MCV: 90.1 fL (ref 80.0–100.0)
MONO ABS: 1.1 10*3/uL — AB (ref 0.2–0.9)
MONOS PCT: 12 %
Neutro Abs: 5.9 10*3/uL (ref 1.4–6.5)
Neutrophils Relative %: 61 %
PLATELETS: 346 10*3/uL (ref 150–440)
RBC: 4.41 MIL/uL (ref 3.80–5.20)
RDW: 14.3 % (ref 11.5–14.5)
WBC: 9.5 10*3/uL (ref 3.6–11.0)

## 2016-01-14 LAB — TROPONIN I

## 2016-01-14 LAB — LACTIC ACID, PLASMA: LACTIC ACID, VENOUS: 1.2 mmol/L (ref 0.5–2.0)

## 2016-01-14 LAB — VALPROIC ACID LEVEL

## 2016-01-14 MED ORDER — TAMSULOSIN HCL 0.4 MG PO CAPS
0.4000 mg | ORAL_CAPSULE | Freq: Every day | ORAL | Status: DC
Start: 2016-01-15 — End: 2016-01-19
  Administered 2016-01-17 – 2016-01-18 (×2): 0.4 mg via ORAL
  Filled 2016-01-14 (×3): qty 1

## 2016-01-14 MED ORDER — GABAPENTIN 250 MG/5ML PO SOLN
250.0000 mg | Freq: Three times a day (TID) | ORAL | Status: DC
  Administered 2016-01-15 – 2016-01-19 (×10): 250 mg via ORAL
  Filled 2016-01-14 (×16): qty 5

## 2016-01-14 MED ORDER — COENZYME Q10 30 MG PO CAPS: 30.0000 mg | ORAL_CAPSULE | Freq: Two times a day (BID) | ORAL | Status: DC

## 2016-01-14 MED ORDER — HEPARIN SODIUM (PORCINE) 5000 UNIT/ML IJ SOLN
5000.0000 [IU] | Freq: Three times a day (TID) | INTRAMUSCULAR | Status: DC
  Administered 2016-01-14 – 2016-01-15 (×3): 5000 [IU] via SUBCUTANEOUS
  Filled 2016-01-14 (×3): qty 1

## 2016-01-14 MED ORDER — LORAZEPAM 2 MG/ML IJ SOLN
1.0000 mg | Freq: Once | INTRAMUSCULAR | Status: AC
  Administered 2016-01-14: 1 mg via INTRAVENOUS

## 2016-01-14 MED ORDER — OXCARBAZEPINE 300 MG PO TABS
300.0000 mg | ORAL_TABLET | Freq: Two times a day (BID) | ORAL | Status: DC
  Administered 2016-01-15 – 2016-01-19 (×8): 300 mg via ORAL
  Filled 2016-01-14 (×9): qty 1

## 2016-01-14 MED ORDER — SODIUM CHLORIDE 0.9 % IV SOLN
INTRAVENOUS | Status: DC
  Administered 2016-01-14 – 2016-01-15 (×2): via INTRAVENOUS
  Administered 2016-01-15: 125 mL/h via INTRAVENOUS
  Administered 2016-01-15: 14:00:00 via INTRAVENOUS

## 2016-01-14 MED ORDER — POTASSIUM CHLORIDE CRYS ER 20 MEQ PO TBCR
20.0000 meq | EXTENDED_RELEASE_TABLET | Freq: Two times a day (BID) | ORAL | Status: DC
  Administered 2016-01-15 – 2016-01-19 (×8): 20 meq via ORAL
  Filled 2016-01-14 (×8): qty 1

## 2016-01-14 MED ORDER — RISAQUAD PO CAPS
1.0000 | ORAL_CAPSULE | Freq: Three times a day (TID) | ORAL | Status: DC
  Administered 2016-01-15 – 2016-01-19 (×11): 1 via ORAL
  Filled 2016-01-14 (×11): qty 1

## 2016-01-14 MED ORDER — LORAZEPAM 2 MG/ML IJ SOLN
INTRAMUSCULAR | Status: AC
  Administered 2016-01-14: 1 mg via INTRAVENOUS
  Filled 2016-01-14: qty 1

## 2016-01-14 MED ORDER — RISPERIDONE 1 MG PO TABS
1.0000 mg | ORAL_TABLET | Freq: Two times a day (BID) | ORAL | Status: DC
  Administered 2016-01-15 – 2016-01-19 (×8): 1 mg via ORAL
  Filled 2016-01-14 (×8): qty 1

## 2016-01-14 MED ORDER — PIPERACILLIN-TAZOBACTAM 3.375 G IVPB
3.3750 g | Freq: Three times a day (TID) | INTRAVENOUS | Status: DC
  Administered 2016-01-14 – 2016-01-17 (×8): 3.375 g via INTRAVENOUS
  Filled 2016-01-14 (×11): qty 50

## 2016-01-14 MED ORDER — VITAMIN D 1000 UNITS PO TABS
1000.0000 [IU] | ORAL_TABLET | Freq: Every day | ORAL | Status: DC
  Administered 2016-01-15 – 2016-01-19 (×5): 1000 [IU] via ORAL
  Filled 2016-01-14 (×5): qty 1

## 2016-01-14 MED ORDER — SODIUM CHLORIDE 0.9 % IV BOLUS (SEPSIS)
1000.0000 mL | Freq: Once | INTRAVENOUS | Status: AC
  Administered 2016-01-14: 1000 mL via INTRAVENOUS

## 2016-01-14 MED ORDER — OMEGA-3-ACID ETHYL ESTERS 1 G PO CAPS
1.0000 g | ORAL_CAPSULE | Freq: Two times a day (BID) | ORAL | Status: DC
  Administered 2016-01-15 – 2016-01-19 (×6): 1 g via ORAL
  Filled 2016-01-14 (×6): qty 1

## 2016-01-14 MED ORDER — PIPERACILLIN-TAZOBACTAM 3.375 G IVPB 30 MIN
3.3750 g | Freq: Once | INTRAVENOUS | Status: AC
  Administered 2016-01-14: 3.375 g via INTRAVENOUS
  Filled 2016-01-14: qty 50

## 2016-01-14 MED ORDER — DOCUSATE SODIUM 100 MG PO CAPS
100.0000 mg | ORAL_CAPSULE | Freq: Two times a day (BID) | ORAL | Status: DC
  Administered 2016-01-15 – 2016-01-19 (×7): 100 mg via ORAL
  Filled 2016-01-14 (×7): qty 1

## 2016-01-14 NOTE — Progress Notes (Signed)
PHARMACIST - PHYSICIAN ORDER COMMUNICATION  CONCERNING: P&T Medication Policy on Herbal Medications  DESCRIPTION:  This patient's order for:  Co-Q-10  has been noted.  This product(s) is classified as an "herbal" or natural product. Due to a lack of definitive safety studies or FDA approval, nonstandard manufacturing practices, plus the potential risk of unknown drug-drug interactions while on inpatient medications, the Pharmacy and Therapeutics Committee does not permit the use of "herbal" or natural products of this type within [place].   ACTION TAKEN: The pharmacy department is unable to verify this order at this time. Please reevaluate patient's clinical condition at discharge and address if the herbal or natural product(s) should be resumed at that time.   

## 2016-01-14 NOTE — ED Notes (Signed)
Patient is tearful, agitated. MD aware. Patient's son states patient has prn Ativan at [facility] that is ordered.
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
## 2016-01-14 NOTE — ED Notes (Signed)
Per EMS report, patient has had decreased mobility and activity and responsiveness to staff for 3 days. Patient has a history of Alzheimers and UTI's. Patient's son told EMS that her mental status changes with UTIs and that usually she is wheel-chair bound and wheels herself around the nursing and interacts with staff. Patient has garbled speech upon arrival and doesn't open eyes to request.

## 2016-01-14 NOTE — Progress Notes (Signed)
Pharmacy Antibiotic Note  Melinda [name] is a 60 y.o. female admitted on 01/14/2016 with sepsis.  Pharmacy has been consulted for Zosyn dosing.  Plan: Will start Zosyn 3.375 g IV q8 hours.   Weight: 200 lb (90.719 kg)  Temp (24hrs), Avg:99.2 F (37.3 C), Min:99.2 F (37.3 C), Max:99.2 F (37.3 C)   Recent Labs Lab 01/14/16 1615  WBC 9.5  CREATININE 0.77    Estimated Creatinine Clearance: 90.2 mL/min (by C-G formula based on Cr of 0.77).    Allergies  Allergen Reactions  . Doxycycline Swelling and Other (See Comments)    Reaction:  Tongue swelling  . Morphine And Related Anaphylaxis  . Ciprofloxacin Other (See Comments)    Reaction:  Hallucinations   . Codeine Nausea And Vomiting  . Keflex [Cephalexin] Other (See Comments)    Reaction:  Hallucinations   . Lodine [Etodolac] Nausea And Vomiting  . Prednisone Other (See Comments)    Reaction:  Unknown   . Sulfa Antibiotics Other (See Comments)    Reaction:  Hallucinations   . Tramadol Other (See Comments)    Reaction:  Dizziness     Antimicrobials this admission:   >>    >>  Dose adjustments this admission:   Microbiology results:  BCx:   UCx:   Sputum:    MRSA PCR:   Thank you for allowing pharmacy to be a part of this patient's care.  Andreia Gandolfi D 01/14/2016 7:16 PM

## 2016-01-15 LAB — BASIC METABOLIC PANEL
ANION GAP: 8 (ref 5–15)
BUN: 19 mg/dL (ref 6–20)
CALCIUM: 8.6 mg/dL — AB (ref 8.9–10.3)
CO2: 24 mmol/L (ref 22–32)
Chloride: 113 mmol/L — ABNORMAL HIGH (ref 101–111)
Creatinine, Ser: 0.48 mg/dL (ref 0.44–1.00)
Glucose, Bld: 105 mg/dL — ABNORMAL HIGH (ref 65–99)
Potassium: 3.4 mmol/L — ABNORMAL LOW (ref 3.5–5.1)
Sodium: 145 mmol/L (ref 135–145)

## 2016-01-15 LAB — CBC
HCT: 31.8 % — ABNORMAL LOW (ref 35.0–47.0)
HEMOGLOBIN: 10.7 g/dL — AB (ref 12.0–16.0)
MCH: 30.2 pg (ref 26.0–34.0)
MCHC: 33.5 g/dL (ref 32.0–36.0)
MCV: 90.2 fL (ref 80.0–100.0)
Platelets: 311 10*3/uL (ref 150–440)
RBC: 3.53 MIL/uL — AB (ref 3.80–5.20)
RDW: 14.2 % (ref 11.5–14.5)
WBC: 10.6 10*3/uL (ref 3.6–11.0)

## 2016-01-15 LAB — MRSA PCR SCREENING: MRSA BY PCR: NEGATIVE

## 2016-01-15 MED ORDER — ENOXAPARIN SODIUM 40 MG/0.4ML ~~LOC~~ SOLN
40.0000 mg | SUBCUTANEOUS | Status: DC
Start: 2016-01-15 — End: 2016-01-19
  Administered 2016-01-15 – 2016-01-18 (×4): 40 mg via SUBCUTANEOUS
  Filled 2016-01-15 (×4): qty 0.4

## 2016-01-15 MED ORDER — ENSURE ENLIVE PO LIQD
237.0000 mL | Freq: Two times a day (BID) | ORAL | Status: DC
  Administered 2016-01-15 – 2016-01-19 (×8): 237 mL via ORAL

## 2016-01-15 NOTE — Progress Notes (Signed)
[facility] [place] NAME: Melinda [name]    MR#:  [id number]  DATE OF BIRTH:  02-Mar-1956  SUBJECTIVE:   Here due to altered mental status secondary to urinary tract infection. Difficult to arouse this morning and lethargic. Has baseline dementia and is nonverbal.    REVIEW OF SYSTEMS:    Review of Systems  Unable to perform ROS: dementia    Nutrition: Heart Healthy Tolerating Diet: Yes Tolerating PT: Bedbound at baseline.    DRUG ALLERGIES:   Allergies  Allergen Reactions  . Doxycycline Swelling and Other (See Comments)    Reaction:  Tongue swelling  . Morphine And Related Anaphylaxis  . Ciprofloxacin Other (See Comments)    Reaction:  Hallucinations   . Codeine Nausea And Vomiting  . Keflex [Cephalexin] Other (See Comments)    Reaction:  Hallucinations   . Lodine [Etodolac] Nausea And Vomiting  . Prednisone Other (See Comments)    Reaction:  Unknown   . Sulfa Antibiotics Other (See Comments)    Reaction:  Hallucinations   . Tramadol Other (See Comments)    Reaction:  Dizziness     VITALS:  Blood pressure 141/108, pulse 83, temperature 98.2 F (36.8 C), temperature source Oral, resp. rate 18, weight 79.606 kg (175 lb 8 oz), SpO2 90 %.  PHYSICAL EXAMINATION:   Physical Exam  GENERAL:  60 y.o.-year-old non-verbal patient lying in bed in no acute distress.  EYES: Pupils equal, round, reactive to light and accommodation. No scleral icterus. Extraocular muscles intact.  HEENT: Head atraumatic, normocephalic. Oropharynx and nasopharynx clear.  NECK:  Supple, no jugular venous distention. No thyroid enlargement, no tenderness.  LUNGS: Normal breath sounds bilaterally, no wheezing, rales, rhonchi. No use of accessory muscles of respiration.  CARDIOVASCULAR: S1, S2 normal. No murmurs, rubs, or gallops.  ABDOMEN: Soft, nontender, nondistended. Bowel sounds present. No organomegaly or mass.  EXTREMITIES: No cyanosis,  clubbing or edema b/l.    NEUROLOGIC: Cranial nerves II through XII are intact. No focal Motor or sensory deficits b/l. Globally weak.     PSYCHIATRIC: The patient is alert and oriented x 1.   SKIN: No obvious rash, lesion, or ulcer.    LABORATORY PANEL:   CBC  Recent Labs Lab 01/15/16 0740  WBC 10.6  HGB 10.7*  HCT 31.8*  PLT 311   ------------------------------------------------------------------------------------------------------------------  Chemistries   Recent Labs Lab 01/14/16 1615 01/15/16 0740  NA 144 145  K 3.4* 3.4*  CL 107 113*  CO2 29 24  GLUCOSE 123* 105*  BUN 23* 19  CREATININE 0.77 0.48  CALCIUM 9.6 8.6*  AST 28  --   ALT 34  --   ALKPHOS 86  --   BILITOT 0.4  --    ------------------------------------------------------------------------------------------------------------------  Cardiac Enzymes  Recent Labs Lab 01/14/16 1615  TROPONINI <0.03   ------------------------------------------------------------------------------------------------------------------  RADIOLOGY:  Dg Chest 1 View  01/14/2016  CLINICAL DATA:  Altered mental status, decreased activity for 3 days EXAM: CHEST 1 VIEW COMPARISON:  Portable exam at 1619 hr compared to 11/19/2015 FINDINGS: Patient's head/chin obscures the RIGHT upper lobe. Enlargement of cardiac silhouette. Mediastinal contours and pulmonary vascularity normal. No definite infiltrate, pleural effusion or pneumothorax within above limitations. Bones demineralized. IMPRESSION: Minimal enlargement of cardiac silhouette. No acute abnormalities. Electronically Signed   By: Lavonia Dana M.D.   On: 01/14/2016 17:01   Ct Head Wo Contrast  01/14/2016  CLINICAL DATA:  Altered mental status. Left-sided weakness. History of dementia  EXAM: CT HEAD WITHOUT CONTRAST TECHNIQUE: Contiguous axial images were obtained from the base of the skull through the vertex without intravenous contrast. COMPARISON:  CT head 12/30/2011 FINDINGS:  Significant progression of ventricular enlargement and subarachnoid space enlargement especially the sylvian fissures. This is compatible with generalized atrophy. Atrophy is now moderately severe and most prominent in the frontal and temporal lobes. Relative sparing of parietal lobes. Negative for acute infarct.  Negative for hemorrhage or mass Negative calvarium IMPRESSION: Significant progression of frontal and temporal atrophy since 2013. Correlate with symptoms of frontotemporal dementia. No acute abnormality Electronically Signed   By: Franchot Gallo M.D.   On: 01/14/2016 16:53     ASSESSMENT AND PLAN:   60 year old female with past medical history of dementia, urinary retention, hyperlipidemia, obstructive sleep apnea who presented to the hospital due to AMS and noted to have a UTI.   1. AMS - metabolic encephalopathy due to UTI.  - non-verbal at baseline.  Mental status stable and at baseline.  - cont. IV abx, fluids and will monitor.   2. UTI - cont. IV Zosyn.  - follow urine cultures.   3. Hx of Urinary Retention - cont. Flomax.   4. Hx of Dementia w/ Behavioral disturbance - cont. Risperdal, Trileptal.    5. Hypokalemia - cont. Potassium supplements.   6. Anxiety - cont. Ativan.    All the records are reviewed and case discussed with Care Management/Social Workerr. Management plans discussed with the patient, family and they are in agreement.  CODE STATUS: Full Code  DVT Prophylaxis: Lovenox  TOTAL TIME TAKING CARE OF THIS PATIENT: 30 minutes.   POSSIBLE D/C IN 1-2 DAYS, DEPENDING ON CLINICAL CONDITION.   Henreitta Leber M.D on 01/15/2016 at 2:12 PM  Between 7am to 6pm - Pager - [phone]  After 6pm go to [email or web address] - password [email or web address] [facility]  Office  [phone]  CC: Primary care physician; Einar Pheasant, MD

## 2016-01-15 NOTE — NC FL2 (Signed)
[place] LEVEL OF CARE SCREENING TOOL     IDENTIFICATION  Patient Name: Melinda [name] Birthdate: 1956-07-15 Sex: female Admission Date (Current Location): 01/14/2016  [place] and [place] Number:  Engineering geologist and Address:  [facility], [address] [place], [place] [address]      Provider Number: [id number]  Attending Physician Name and Address:  Henreitta Leber, MD  Relative Name and Phone Number:       Current Level of Care: Hospital Recommended Level of Care: [place] Prior Approval Number:    Date Approved/Denied:   PASRR Number:  ([id number])  Discharge Plan: SNF    Current Diagnoses: Patient Active Problem List   Diagnosis Date Noted  . Sepsis ([place]) 01/14/2016  . UTI (lower urinary tract infection) 01/14/2016  . 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 Plaque psoriasis 05/05/2014  . Estrogen deficiency 05/05/2014  . Gastritis 12/18/2013  . Belching symptom 08/23/2013  . Diffuse papular rash 11/06/2012  . Hypercholesterolemia 10/29/2012  . Sleep apnea 05/24/2012  . Arthritis 04/26/2012  . Vertigo 04/26/2012    Orientation RESPIRATION BLADDER Height & Weight     Self  Normal Incontinent Weight: 175 lb 8 oz (79.606 kg) Height:     BEHAVIORAL SYMPTOMS/MOOD NEUROLOGICAL BOWEL NUTRITION STATUS   (none )  (none ) Continent Diet (Heart Healthy )  AMBULATORY STATUS COMMUNICATION OF NEEDS Skin   Extensive Assist (Wheel Chair Bound ) Verbally Normal                       Personal Care Assistance Level of Assistance  Bathing, Dressing, Feeding Bathing Assistance: Limited assistance Feeding assistance: Limited assistance Dressing Assistance: Limited assistance     Functional Limitations Info  Sight, Hearing, Speech Sight Info: Adequate Hearing Info: Adequate Speech Info: Adequate    SPECIAL CARE FACTORS FREQUENCY                       Contractures       Additional Factors Info  Code Status, Allergies, Psychotropic Code Status Info:  (DNR ) Allergies Info:  (Doxycycline, Morphine And Related, Ciprofloxacin, Codeine, Keflex, Lodine, Prednisone, Sulfa Antibiotics, Tramadol) Psychotropic Info:  (Risperdal)         Current Medications (01/15/2016):  This is the current hospital active medication list Current Facility-Administered Medications  Medication Dose Route Frequency Provider Last Rate Last Dose  . 0.9 %  sodium chloride infusion   Intravenous Continuous Vaughan Basta, MD 125 mL/hr at 01/15/16 0629 125 mL/hr at 01/15/16 0629  . acidophilus (RISAQUAD) capsule 1 capsule  1 capsule Oral TID Vaughan Basta, MD   1 capsule at 01/14/16 2248  . cholecalciferol (VITAMIN D) tablet 1,000 Units  1,000 Units Oral Daily Vaughan Basta, MD      . docusate sodium (COLACE) capsule 100 mg  100 mg Oral BID Vaughan Basta, MD   100 mg at 01/14/16 2249  . gabapentin (NEURONTIN) 250 MG/5ML solution 250 mg  250 mg Oral TID Vaughan Basta, MD   250 mg at 01/14/16 2249  . heparin injection 5,000 Units  5,000 Units Subcutaneous Q8H Vaughan Basta, MD   5,000 Units at 01/15/16 0522  . omega-3 acid ethyl esters (LOVAZA) capsule 1 g  1 g Oral BID Vaughan Basta, MD   1 g at 01/14/16 2250  . Oxcarbazepine (TRILEPTAL) tablet 300 mg  300 mg Oral BID Vaughan Basta, MD   300 mg at  01/14/16 2250  . piperacillin-tazobactam (ZOSYN) IVPB 3.375 g  3.375 g Intravenous Q8H Vaughan Basta, MD   3.375 g at 01/14/16 2347  . potassium chloride SA (K-DUR,KLOR-CON) CR tablet 20 mEq  20 mEq Oral BID Vaughan Basta, MD   20 mEq at 01/14/16 0200  . risperiDONE (RISPERDAL) tablet 1 mg  1 mg Oral BID Vaughan Basta, MD   1 mg at 01/14/16 2251  . tamsulosin (FLOMAX) capsule 0.4 mg  0.4 mg Oral QPC supper Vaughan Basta, MD         Discharge Medications: Please see discharge summary for a list of  discharge medications.  Relevant Imaging Results:  Relevant Lab Results:   Additional Information  (SSN: [id number])  Loralyn Freshwater, LCSW

## 2016-01-15 NOTE — Progress Notes (Signed)
Initial Nutrition Assessment  DOCUMENTATION CODES:   Not applicable  INTERVENTION:   Cater to pt preferences on Heart healthy diet order. Recommend downgrading to Dysphagia III if pt with difficulty chewing/ease of eating. Recommend Ensure Enlive po BID, each supplement provides 350 kcal and 20 grams of protein   NUTRITION DIAGNOSIS:   Inadequate oral intake related to acute illness as evidenced by meal completion < 25%.  GOAL:   Patient will meet greater than or equal to 90% of their needs  MONITOR:   PO intake, Supplement acceptance, Labs, Weight trends, I & O's  REASON FOR ASSESSMENT:   Low [place]    ASSESSMENT:   Pt admitted with sepsis secondary to UTI. Pt with h/o dementia from [facility].  Past Medical History  Diagnosis Date  . Arthritis   . Vertigo   . Hypercholesterolemia   . Sleep apnea   . Urinary retention   . Dementia     Diet Order:  Diet Heart Room service appropriate?: Yes; Fluid consistency:: Thin   Pt sleeping this am, unable to arouse. Pt is a feeder. No family at bedside. Per paper chart from [facility], pt on Regular consistencies and thin liquids PTA.  Observed pt tray this am at breakfast, pt ate a few bites and drank orange juice. Per Nsg pt confused this am when taking vitals.   RD revisited this afternoon, pt remains asleep, lunch tray at bedside untouched.   Medications: Colace, Omega-3, KCl, NS at 134mL/hr Labs: K 3.4   Gastrointestinal Profile: Last BM:  no BM documented   Nutrition-Focused Physical Exam Findings:  Unable to complete Nutrition-Focused physical exam at this time.    Weight Change: Per weight trends in [facility] weight fluctuates, weight 178lbs one year ago  Skin:  Reviewed, no issues   Height:   Ht Readings from Last 1 Encounters:  11/17/15 5\' 10"  (1.778 m)    Weight:   Wt Readings from Last 1 Encounters:  01/14/16 175 lb 8 oz (79.606 kg)   Wt Readings from Last 10  Encounters:  01/14/16 175 lb 8 oz (79.606 kg)  11/17/15 210 lb (95.255 kg)  01/11/15 178 lb (80.74 kg)  12/17/14 188 lb (85.276 kg)  12/12/14 165 lb (74.844 kg)  12/12/14 170 lb (77.111 kg)  12/12/14 165 lb (74.844 kg)  04/30/14 186 lb 4 oz (84.482 kg)  09/27/13 185 lb 8 oz (84.142 kg)  08/23/13 160 lb (72.576 kg)     BMI:  Body mass index is 25.18 kg/(m^2).  Estimated Nutritional Needs:   Kcal:  1900-2250kcals  Protein:  80-96g protein  Fluid:  >/= 1.9L fluid  EDUCATION NEEDS:   Education needs no appropriate at this time  Dwyane Luo, RD, LDN Pager [phone] Weekend/On-Call Pager [phone]

## 2016-01-15 NOTE — Clinical Social Work Note (Addendum)
Clinical Social Work Assessment  Patient Details  Name: Melinda [name] MRN: [id number] Date of Birth: 1956-06-19  Date of referral:  01/15/16               Reason for consult:  Facility Placement, Other (Comment Required) (From [facility])                Permission sought to share information with:    Permission granted to share information::     Name::        Agency::     Relationship::     Contact Information:     Housing/Transportation Living arrangements for the past 2 months:  [place] of Information:  Facility Patient Interpreter Needed:  None Criminal Activity/Legal Involvement Pertinent to Current Situation/Hospitalization:  No - Comment as needed Significant Relationships:  Adult Children Lives with:  Facility Resident Do you feel safe going back to the place where you live?  Yes Need for family participation in patient care:  Yes (Comment)  Care giving concerns:  Patient is a long term care resident at [facility].    Social Worker assessment / plan:  Holiday representative (CSW) received consult that patient is from [facility]. Per [place] admissions coordinator at [facility] patient is a long term care resident and can return when stable. CSW attempted to meet with patient however she was asleep and per chart she is oriented to self only. CSW attempted to call patient's son Shanon Brow however he did not answer and voicemail was left. Per chart patient has dementia and has had a behavioral health admission.   FL2 complete and faxed out. Plan is for patient to return to [facility].      Patient's son Shanon Brow called CSW back and reported that he is patient's HPOA. Per son patient has been at [facility] for about 1 month now and it is unclear if patient will transition to long term care. Per son if patient becomes stable enough and her behaviors are managed then he would like to bring her  home with 24/7 supervision. Per son he has a pending long term care Medicaid application and patient will have Medicare starting in November 2017. Son is agreeable for patient to return to [facility].   Employment status:  Disabled (Comment on whether or not currently receiving Disability), Retired Insurance underwriter information:  Other (Comment Required) Sports administrator) PT Recommendations:  Not assessed at this time Information / Referral to community resources:  [place]  Patient/Family's Response to care:  CSW is waiting on a return call from patient's son Shanon Brow.   Patient/Family's Understanding of and Emotional Response to Diagnosis, Current Treatment, and Prognosis: CSW is waiting on a return call from patient's son Shanon Brow.   Emotional Assessment Appearance:  Appears stated age Attitude/Demeanor/Rapport:  Unable to Assess Affect (typically observed):  Unable to Assess Orientation:  Oriented to Self, Fluctuating Orientation (Suspected and/or reported Sundowners) Alcohol / Substance use:  Not Applicable Psych involvement (Current and /or in the community):  No (Comment)  Discharge Needs  Concerns to be addressed:  Discharge Planning Concerns Readmission within the last 30 days:  No Current discharge risk:  Chronically ill, Cognitively Impaired, Dependent with Mobility Barriers to Discharge:  Continued Medical Work up   Loralyn Freshwater, LCSW 01/15/2016, 11:34 AM

## 2016-01-16 DIAGNOSIS — N390 Urinary tract infection, site not specified: Principal | ICD-10-CM

## 2016-01-16 DIAGNOSIS — G9341 Metabolic encephalopathy: Secondary | ICD-10-CM

## 2016-01-16 DIAGNOSIS — D649 Anemia, unspecified: Secondary | ICD-10-CM

## 2016-01-16 DIAGNOSIS — R42 Dizziness and giddiness: Secondary | ICD-10-CM

## 2016-01-16 DIAGNOSIS — Z515 Encounter for palliative care: Secondary | ICD-10-CM

## 2016-01-16 DIAGNOSIS — F0390 Unspecified dementia without behavioral disturbance: Secondary | ICD-10-CM

## 2016-01-16 DIAGNOSIS — R410 Disorientation, unspecified: Secondary | ICD-10-CM

## 2016-01-16 DIAGNOSIS — E876 Hypokalemia: Secondary | ICD-10-CM

## 2016-01-16 DIAGNOSIS — A419 Sepsis, unspecified organism: Secondary | ICD-10-CM

## 2016-01-16 DIAGNOSIS — M1990 Unspecified osteoarthritis, unspecified site: Secondary | ICD-10-CM

## 2016-01-16 LAB — BASIC METABOLIC PANEL
Anion gap: 5 (ref 5–15)
BUN: 13 mg/dL (ref 6–20)
CHLORIDE: 110 mmol/L (ref 101–111)
CO2: 24 mmol/L (ref 22–32)
CREATININE: 0.51 mg/dL (ref 0.44–1.00)
Calcium: 8.4 mg/dL — ABNORMAL LOW (ref 8.9–10.3)
GFR calc Af Amer: >60 mL/min (ref >60)
GFR calc non Af Amer: >60 mL/min (ref >60)
Glucose, Bld: 93 mg/dL (ref 65–99)
Potassium: 3.6 mmol/L (ref 3.5–5.1)
Sodium: 139 mmol/L (ref 135–145)

## 2016-01-16 NOTE — Progress Notes (Signed)
Per MD patient is not medically stable for D/C today. Per [place] admissions coordinator at [facility] patient can return to [facility] over the weekend to room 32-B (fax: [phone]). Clinical Social Worker (CSW) will continue to follow and assist as needed.   Blima Rich, LCSW [phone]
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
## 2016-01-16 NOTE — Progress Notes (Signed)
Pharmacy Antibiotic Note  Melinda [name] is a 60 y.o. female admitted on 01/14/2016 with sepsis.  Pharmacy has been consulted for piperacillin/tazobactam dosing.  This is day #3 of antibiotics.  Plan: Continue piperacillin/tazobactam 3.375 g IV q8h EI  Weight: 175 lb 8 oz (79.606 kg)  Temp (24hrs), Avg:99.2 F (37.3 C), Min:98.6 F (37 C), Max:99.7 F (37.6 C)   Recent Labs Lab 01/14/16 1615 01/14/16 1852 01/15/16 0740 01/16/16 0419  WBC 9.5  --  10.6  --   CREATININE 0.77  --  0.48 0.51  LATICACIDVEN  --  1.2  --   --     Estimated Creatinine Clearance: 79.9 mL/min (by C-G formula based on Cr of 0.51).    Allergies  Allergen Reactions  . Doxycycline Swelling and Other (See Comments)    Reaction:  Tongue swelling  . Morphine And Related Anaphylaxis  . Ciprofloxacin Other (See Comments)    Reaction:  Hallucinations   . Codeine Nausea And Vomiting  . Keflex [Cephalexin] Other (See Comments)    Reaction:  Hallucinations   . Lodine [Etodolac] Nausea And Vomiting  . Prednisone Other (See Comments)    Reaction:  Unknown   . Sulfa Antibiotics Other (See Comments)    Reaction:  Hallucinations   . Tramadol Other (See Comments)    Reaction:  Dizziness    Antimicrobials this admission: Piperacillin/tazobactam 6/21  >>   Dose adjustments this admission:  Microbiology results:  BCx: No growth < 24 hours  UCx: Sent   MRSA PCR: negative   Thank you for allowing pharmacy to be a part of this patient's care.  Lenis Noon, PharmD Clinical Pharmacist 01/16/2016 9:27 AM

## 2016-01-16 NOTE — Progress Notes (Signed)
Pt lethargic resting between care. Pt refuses to take medication.
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
## 2016-01-16 NOTE — Progress Notes (Signed)
Spoke with pt's daughter and son, password set-up. Per family, pt is usually alert and responsive with clear speech, confused to place/time/situation. Family concerned that pt continues to be sedate/drowsy. Pt slightly more responsive this AM, will answer some questions when prompted, refuses to open eyes, raised hand when prompted.Will inform MD, continue to monitor.

## 2016-01-16 NOTE — Progress Notes (Signed)
[place] at [place] NAME: Melinda [name]    MR#:  [id number]  DATE OF BIRTH:  [birth year]  SUBJECTIVE:   Here due to altered mental status secondary to urinary tract infection. Difficult to arouse this morning and lethargic. Has baseline dementia and is nonverbal.    REVIEW OF SYSTEMS:    Review of Systems  Unable to perform ROS: dementia    Nutrition: Heart Healthy Tolerating Diet: Yes Tolerating PT: Bedbound at baseline.    DRUG ALLERGIES:   Allergies  Allergen Reactions  . Doxycycline Swelling and Other (See Comments)    Reaction:  Tongue swelling  . Morphine And Related Anaphylaxis  . Ciprofloxacin Other (See Comments)    Reaction:  Hallucinations   . Codeine Nausea And Vomiting  . Keflex [Cephalexin] Other (See Comments)    Reaction:  Hallucinations   . Lodine [Etodolac] Nausea And Vomiting  . Prednisone Other (See Comments)    Reaction:  Unknown   . Sulfa Antibiotics Other (See Comments)    Reaction:  Hallucinations   . Tramadol Other (See Comments)    Reaction:  Dizziness     VITALS:  Blood pressure 93/42, pulse 67, temperature 98.9 F (37.2 C), temperature source Oral, resp. rate 16, weight 79.606 kg (175 lb 8 oz), SpO2 96 %.  PHYSICAL EXAMINATION:   Physical Exam  GENERAL:  60 y.o.-year-old non-verbal patient lying in bed in no acute distress.  EYES: Pupils equal, round, reactive to light and accommodation. No scleral icterus. Extraocular muscles intact.  HEENT: Head atraumatic, normocephalic. Oropharynx and nasopharynx clear.  NECK:  Supple, no jugular venous distention. No thyroid enlargement, no tenderness.  LUNGS: Normal breath sounds bilaterally, no wheezing, rales, rhonchi. No use of accessory muscles of respiration.  CARDIOVASCULAR: S1, S2 normal. No murmurs, rubs, or gallops.  ABDOMEN: Soft, nontender, nondistended. Bowel sounds present. No organomegaly or mass.  EXTREMITIES: No cyanosis,  clubbing or edema b/l.    NEUROLOGIC: Cranial nerves II through XII are intact. No focal Motor or sensory deficits b/l. Globally weak.     PSYCHIATRIC: The patient is alert and oriented x 1.   SKIN: No obvious rash, lesion, or ulcer.    LABORATORY PANEL:   CBC  Recent Labs Lab 01/15/16 0740  WBC 10.6  HGB 10.7*  HCT 31.8*  PLT 311   ------------------------------------------------------------------------------------------------------------------  Chemistries   Recent Labs Lab 01/14/16 1615  01/16/16 0419  NA 144  < > 139  K 3.4*  < > 3.6  CL 107  < > 110  CO2 29  < > 24  GLUCOSE 123*  < > 93  BUN 23*  < > 13  CREATININE 0.77  < > 0.51  CALCIUM 9.6  < > 8.4*  AST 28  --   --   ALT 34  --   --   ALKPHOS 86  --   --   BILITOT 0.4  --   --   < > = values in this interval not displayed. ------------------------------------------------------------------------------------------------------------------  Cardiac Enzymes  Recent Labs Lab 01/14/16 1615  TROPONINI <0.03   ------------------------------------------------------------------------------------------------------------------  RADIOLOGY:  Dg Chest 1 View  01/14/2016  CLINICAL DATA:  Altered mental status, decreased activity for 3 days EXAM: CHEST 1 VIEW COMPARISON:  Portable exam at 1619 hr compared to 11/19/2015 FINDINGS: Patient's head/chin obscures the RIGHT upper lobe. Enlargement of cardiac silhouette. Mediastinal contours and pulmonary vascularity normal. No definite infiltrate, pleural effusion or pneumothorax within above limitations.  Bones demineralized. IMPRESSION: Minimal enlargement of cardiac silhouette. No acute abnormalities. Electronically Signed   By: Lavonia Dana M.D.   On: 01/14/2016 17:01   Ct Head Wo Contrast  01/14/2016  CLINICAL DATA:  Altered mental status. Left-sided weakness. History of dementia EXAM: CT HEAD WITHOUT CONTRAST TECHNIQUE: Contiguous axial images were obtained from the base of  the skull through the vertex without intravenous contrast. COMPARISON:  CT head 12/30/2011 FINDINGS: Significant progression of ventricular enlargement and subarachnoid space enlargement especially the sylvian fissures. This is compatible with generalized atrophy. Atrophy is now moderately severe and most prominent in the frontal and temporal lobes. Relative sparing of parietal lobes. Negative for acute infarct.  Negative for hemorrhage or mass Negative calvarium IMPRESSION: Significant progression of frontal and temporal atrophy since 2013. Correlate with symptoms of frontotemporal dementia. No acute abnormality Electronically Signed   By: Franchot Gallo M.D.   On: 01/14/2016 16:53     ASSESSMENT AND PLAN:   60 year old female with past medical history of dementia, urinary retention, hyperlipidemia, obstructive sleep apnea who presented to the hospital due to AMS and noted to have a UTI.   1. AMS - metabolic encephalopathy due to UTI/underlying Dementia.  - still quite lethargic, encephalopathic and as per family this is not her baseline.  - cont. IV abx, fluids and supportive care and will monitor mental status.  - avoid Benzo's.  PRN haldol for agitation.     2. UTI - cont. IV Zosyn.  - urine cultures growing Proteus but ID & Sensitivities pending.   3. Hx of Urinary Retention - cont. Flomax.   4. Hx of Dementia w/ Behavioral disturbance - cont. Risperdal, Trileptal.    5. Hypokalemia - improved w/ supplementation and will monitor.   6. Neuropathy - cont. Neurontin.   All the records are reviewed and case discussed with Care Management/Social Workerr. Management plans discussed with the patient, family and they are in agreement.  CODE STATUS: Full Code  DVT Prophylaxis: Lovenox  TOTAL TIME TAKING CARE OF THIS PATIENT: 25 minutes.   POSSIBLE D/C IN 1-2 DAYS, DEPENDING ON CLINICAL CONDITION.   Henreitta Leber M.D on 01/16/2016 at 2:40 PM  Between 7am to 6pm - Pager -  [phone]  After 6pm go to [email or web address] - password [email or web address] [facility]  Office  [phone]  CC: Primary care physician; Einar Pheasant, MD

## 2016-01-17 LAB — URINE CULTURE: Culture: >=100000 — AB

## 2016-01-17 LAB — AMMONIA: Ammonia: 10 umol/L (ref 9–35)

## 2016-01-17 MED ORDER — DEXTROSE 5 % IV SOLN
1.0000 g | INTRAVENOUS | Status: DC
  Administered 2016-01-17 – 2016-01-18 (×2): 1 g via INTRAVENOUS
  Filled 2016-01-17 (×3): qty 10

## 2016-01-17 NOTE — Progress Notes (Signed)
[facility] at [place] NAME: Melinda [name]    MR#:  [id number]  DATE OF BIRTH:  1955/08/13  SUBJECTIVE:   Here due to altered mental status secondary to urinary tract infection.  A bit more awake today.  Follows some simple commands. Urine growing Proteus.    REVIEW OF SYSTEMS:    Review of Systems  Unable to perform ROS: dementia    Nutrition: Heart Healthy Tolerating Diet: Yes Tolerating PT: Bedbound at baseline.    DRUG ALLERGIES:   Allergies  Allergen Reactions  . Doxycycline Swelling and Other (See Comments)    Reaction:  Tongue swelling  . Morphine And Related Anaphylaxis  . Ciprofloxacin Other (See Comments)    Reaction:  Hallucinations   . Codeine Nausea And Vomiting  . Keflex [Cephalexin] Other (See Comments)    Reaction:  Hallucinations   . Lodine [Etodolac] Nausea And Vomiting  . Prednisone Other (See Comments)    Reaction:  Unknown   . Sulfa Antibiotics Other (See Comments)    Reaction:  Hallucinations   . Tramadol Other (See Comments)    Reaction:  Dizziness     VITALS:  Blood pressure 107/55, pulse 74, temperature 98.9 F (37.2 C), temperature source Axillary, resp. rate 18, weight 79.606 kg (175 lb 8 oz), SpO2 97 %.  PHYSICAL EXAMINATION:   Physical Exam  GENERAL:  60 y.o.-year-old non-verbal patient lying in bed in no acute distress.  EYES: Pupils equal, round, reactive to light and accommodation. No scleral icterus. Extraocular muscles intact.  HEENT: Head atraumatic, normocephalic. Oropharynx and nasopharynx clear.  NECK:  Supple, no jugular venous distention. No thyroid enlargement, no tenderness.  LUNGS: Normal breath sounds bilaterally, no wheezing, rales, rhonchi. No use of accessory muscles of respiration.  CARDIOVASCULAR: S1, S2 normal. No murmurs, rubs, or gallops.  ABDOMEN: Soft, nontender, nondistended. Bowel sounds present. No organomegaly or mass.  EXTREMITIES: No cyanosis, clubbing or  edema b/l.    NEUROLOGIC: Cranial nerves II through XII are intact. No focal Motor or sensory deficits b/l. Globally weak.     PSYCHIATRIC: The patient is alert and oriented x 1.   SKIN: No obvious rash, lesion, or ulcer.    LABORATORY PANEL:   CBC  Recent Labs Lab 01/15/16 0740  WBC 10.6  HGB 10.7*  HCT 31.8*  PLT 311   ------------------------------------------------------------------------------------------------------------------  Chemistries   Recent Labs Lab 01/14/16 1615  01/16/16 0419  NA 144  < > 139  K 3.4*  < > 3.6  CL 107  < > 110  CO2 29  < > 24  GLUCOSE 123*  < > 93  BUN 23*  < > 13  CREATININE 0.77  < > 0.51  CALCIUM 9.6  < > 8.4*  AST 28  --   --   ALT 34  --   --   ALKPHOS 86  --   --   BILITOT 0.4  --   --   < > = values in this interval not displayed. ------------------------------------------------------------------------------------------------------------------  Cardiac Enzymes  Recent Labs Lab 01/14/16 1615  TROPONINI <0.03   ------------------------------------------------------------------------------------------------------------------  RADIOLOGY:  No results found.   ASSESSMENT AND PLAN:   60 year old female with past medical history of dementia, urinary retention, hyperlipidemia, obstructive sleep apnea who presented to the hospital due to AMS and noted to have a UTI.   1. AMS - metabolic encephalopathy due to UTI/underlying Dementia.  - mental status a bit improved today but not  back to baseline yet. - cont. IV abx (Will changed from Zosyn to Ceftriaxone), cont.  fluids and supportive care and will monitor mental status.  - avoid Benzo's.  PRN haldol for agitation.     2. UTI - Urine culture growing Proteus And it's sensitive to ceftriaxone. -Change IV antibiotics from IV Zosyn to ceftriaxone.  3. Hx of Urinary Retention - cont. Flomax.  -Continue in and out catheterization as needed.  4. Hx of Dementia w/ Behavioral  disturbance - cont. Risperdal, Trileptal.   -Appreciate psychiatric consult and continue current meds.    5. Hypokalemia - improved w/ supplementation and will monitor.   6. Neuropathy - cont. Neurontin.   All the records are reviewed and case discussed with Care Management/Social Workerr. Management plans discussed with the patient, family and they are in agreement.  CODE STATUS: Full Code  DVT Prophylaxis: Lovenox  TOTAL TIME TAKING CARE OF THIS PATIENT: 25 minutes.   POSSIBLE D/C IN 1-2 DAYS, DEPENDING ON CLINICAL CONDITION.   Henreitta Leber M.D on 01/17/2016 at 1:03 PM  Between 7am to 6pm - Pager - [phone]  After 6pm go to [email or web address] - password [email or web address] [facility]  Office  [phone]  CC: Primary care physician; Einar Pheasant, MD
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
## 2016-01-18 NOTE — Progress Notes (Signed)
[facility] at [place] NAME: Melinda [name]    MR#:  [id number]  DATE OF BIRTH:  Jan 23, 1956  SUBJECTIVE:   Here due to altered mental status secondary to urinary tract infection.  A bit more awake today.  Follows some simple commands. Urine growing Proteus.   Ate all breakfast per RN  REVIEW OF SYSTEMS:    Review of Systems  Unable to perform ROS: dementia    Nutrition: Heart Healthy Tolerating Diet: Yes Tolerating PT: Bedbound at baseline.    DRUG ALLERGIES:   Allergies  Allergen Reactions  . Doxycycline Swelling and Other (See Comments)    Reaction:  Tongue swelling  . Morphine And Related Anaphylaxis  . Ciprofloxacin Other (See Comments)    Reaction:  Hallucinations   . Codeine Nausea And Vomiting  . Keflex [Cephalexin] Other (See Comments)    Reaction:  Hallucinations   . Lodine [Etodolac] Nausea And Vomiting  . Prednisone Other (See Comments)    Reaction:  Unknown   . Sulfa Antibiotics Other (See Comments)    Reaction:  Hallucinations   . Tramadol Other (See Comments)    Reaction:  Dizziness     VITALS:  Blood pressure 100/60, pulse 68, temperature 99.3 F (37.4 C), temperature source Oral, resp. rate 18, weight 79.606 kg (175 lb 8 oz), SpO2 95 %.  PHYSICAL EXAMINATION:   Physical Exam  GENERAL:  60 y.o.-year-old non-verbal patient lying in bed in no acute distress.  EYES: Pupils equal, round, reactive to light and accommodation. No scleral icterus. Extraocular muscles intact.  HEENT: Head atraumatic, normocephalic. Oropharynx and nasopharynx clear.  NECK:  Supple, no jugular venous distention. No thyroid enlargement, no tenderness.  LUNGS: Normal breath sounds bilaterally, no wheezing, rales, rhonchi. No use of accessory muscles of respiration.  CARDIOVASCULAR: S1, S2 normal. No murmurs, rubs, or gallops.  ABDOMEN: Soft, nontender, nondistended. Bowel sounds present. No organomegaly or mass.  EXTREMITIES: No  cyanosis, clubbing or edema b/l.    NEUROLOGIC: Cranial nerves II through XII are intact. No focal Motor or sensory deficits b/l. Globally weak.     PSYCHIATRIC: The patient is alert and oriented x 1.   SKIN: No obvious rash, lesion, or ulcer.    LABORATORY PANEL:   CBC  Recent Labs Lab 01/15/16 0740  WBC 10.6  HGB 10.7*  HCT 31.8*  PLT 311   ------------------------------------------------------------------------------------------------------------------  Chemistries   Recent Labs Lab 01/14/16 1615  01/16/16 0419  NA 144  < > 139  K 3.4*  < > 3.6  CL 107  < > 110  CO2 29  < > 24  GLUCOSE 123*  < > 93  BUN 23*  < > 13  CREATININE 0.77  < > 0.51  CALCIUM 9.6  < > 8.4*  AST 28  --   --   ALT 34  --   --   ALKPHOS 86  --   --   BILITOT 0.4  --   --   < > = values in this interval not displayed. ------------------------------------------------------------------------------------------------------------------  Cardiac Enzymes  Recent Labs Lab 01/14/16 1615  TROPONINI <0.03   ------------------------------------------------------------------------------------------------------------------  RADIOLOGY:  No results found.   ASSESSMENT AND PLAN:   60 year old female with past medical history of dementia, urinary retention, hyperlipidemia, obstructive sleep apnea who presented to the hospital due to AMS and noted to have a UTI.   1. AMS - metabolic encephalopathy due to UTI/underlying Dementia.  - mental status a  bit improved today but not back to baseline yet. - cont. IV abx (Will changed from Zosyn to Ceftriaxone), cont.  fluids and supportive care and will monitor mental status.  - avoid Benzo's.  PRN haldol for agitation.     2. UTI - Urine culture growing Proteus And it's sensitive to ceftriaxone. -Change IV antibiotics from IV Zosyn to ceftriaxone.  3. Hx of Urinary Retention - cont. Flomax.  -Continue in and out catheterization as needed.  4. Hx of  Dementia w/ Behavioral disturbance - cont. Risperdal, Trileptal.   -Appreciate psychiatric consult and continue current meds.    5. Hypokalemia - improved w/ supplementation and will monitor.   6. Neuropathy - cont. Neurontin.    7. CSW for d/c planning to [facility] in am if remains stable All the records are reviewed and case discussed with Care Management/Social Workerr. Management plans discussed with the patient, family and they are in agreement.  CODE STATUS: Full Code  DVT Prophylaxis: Lovenox  TOTAL TIME TAKING CARE OF THIS PATIENT: 25 minutes.   POSSIBLE D/C IN 1-2 DAYS, DEPENDING ON CLINICAL CONDITION.   Kamari Bilek M.D on 01/18/2016 at 1:59 PM  Between 7am to 6pm - Pager - [phone]  After 6pm go to [email or web address] - password [email or web address] [facility]  Office  [phone]  CC: Primary care physician; Einar Pheasant, MD

## 2016-01-19 ENCOUNTER — Telehealth: Payer: Self-pay | Admitting: Internal Medicine

## 2016-01-19 LAB — CULTURE, BLOOD (ROUTINE X 2)
Culture: NO GROWTH
Culture: NO GROWTH

## 2016-01-19 MED ORDER — CEPHALEXIN 250 MG PO CAPS
500.0000 mg | ORAL_CAPSULE | Freq: Two times a day (BID) | ORAL | Status: DC
  Administered 2016-01-19: 500 mg via ORAL
  Filled 2016-01-19: qty 2

## 2016-01-19 MED ORDER — CEPHALEXIN 250 MG/5ML PO SUSR
500.0000 mg | Freq: Two times a day (BID) | ORAL | Status: DC
  Filled 2016-01-19: qty 10

## 2016-01-19 MED ORDER — METOPROLOL TARTRATE 25 MG PO TABS
12.5000 mg | ORAL_TABLET | Freq: Two times a day (BID) | ORAL | Status: AC
Start: 2016-01-19 — End: ?

## 2016-01-19 MED ORDER — CEPHALEXIN 500 MG PO CAPS: 500.0000 mg | ORAL_CAPSULE | Freq: Two times a day (BID) | ORAL | Status: DC

## 2016-01-19 MED ORDER — CEPHALEXIN 250 MG/5ML PO SUSR
500.0000 mg | Freq: Two times a day (BID) | ORAL | Status: AC
Start: 2016-01-19 — End: ?

## 2016-01-19 NOTE — Telephone Encounter (Signed)
Patient has moved care to [facility] because it is more convenient for her.  She now resides in SNF where she is cared for by MD at facility. Please remove Dr.Scott as PCP.

## 2016-01-19 NOTE — Progress Notes (Signed)
Patient is medically stable for D/C back to [facility] today. Per [place] admissions coordinator at [facility] patient will go to room 32-B. RN will call report and arrange EMS for transport. Clinical Education officer, museum (CSW) sent D/C Summary, FL2 and D/C Packet to [place] via [place]. CSW contacted patient's son Shanon Brow and left him a voicemail making him aware of above. Please reconsult if future social work needs arise. CSW signing off.   Blima Rich, LCSW  [phone]

## 2016-01-19 NOTE — Telephone Encounter (Signed)
Ok. Done 

## 2016-01-19 NOTE — Discharge Summary (Signed)
[facility] [place] NAME: Melinda [name]    MR#:  [id number]  DATE OF BIRTH:  29-Nov-1955  DATE OF ADMISSION:  01/14/2016 ADMITTING PHYSICIAN: Vaughan Basta, MD  DATE OF DISCHARGE: 01/19/16  PRIMARY CARE PHYSICIAN: Einar Pheasant, MD    ADMISSION DIAGNOSIS:  UTI (lower urinary tract infection) [N39.0]  DISCHARGE DIAGNOSIS:  Encephalopathy due to UTI Advance frontoparietal dementia SECONDARY DIAGNOSIS:   Past Medical History  Diagnosis Date  . Arthritis   . Vertigo   . Hypercholesterolemia   . Sleep apnea   . Urinary retention   . Dementia     HOSPITAL COURSE:   60 year old female with past medical history of dementia, urinary retention, hyperlipidemia, obstructive sleep apnea who presented to the hospital due to AMS and noted to have a UTI.   1. AMS - metabolic encephalopathy due to UTI/underlying Dementia.  - mental status at baseline. Alert and talkative - cont.po keflex. (no issues with hallucinations noted) - avoid Benzo's. PRN haldol for agitation.   2. UTI - Urine culture growing Proteus And it's sensitive to ceftriaxone. -po kelfex  3. Hx of Urinary Retention - cont. Flomax.  -Continue in and out catheterization as needed.  4. Hx of Dementia w/ Behavioral disturbance - cont. Risperdal, Trileptal.  -Appreciate psychiatric consult and continue current meds.   5. Hypokalemia - improved w/ supplementation and will monitor.   6. Neuropathy - cont. Neurontin.   Overall stable D/c to STR CONSULTS OBTAINED:  Treatment Team:  Gonzella Lex, MD  DRUG ALLERGIES:   Allergies  Allergen Reactions  . Doxycycline Swelling and Other (See Comments)    Reaction:  Tongue swelling  . Morphine And Related Anaphylaxis  . Ciprofloxacin Other (See Comments)    Reaction:  Hallucinations   . Codeine Nausea And Vomiting  . Keflex [Cephalexin] Other (See Comments)    Reaction:  Hallucinations    . Lodine [Etodolac] Nausea And Vomiting  . Prednisone Other (See Comments)    Reaction:  Unknown   . Sulfa Antibiotics Other (See Comments)    Reaction:  Hallucinations   . Tramadol Other (See Comments)    Reaction:  Dizziness     DISCHARGE MEDICATIONS:   Current Discharge Medication List    START taking these medications   Details  cephALEXin (KEFLEX) 250 MG/5ML suspension Take 10 mLs (500 mg total) by mouth every 12 (twelve) hours. For 3 more days Qty: 100 mL, Refills: 0      CONTINUE these medications which have CHANGED   Details  metoprolol tartrate (LOPRESSOR) 25 MG tablet Take 0.5 tablets (12.5 mg total) by mouth 2 (two) times daily. HOLD IF BP is < [id number] systolic Qty: 30 tablet, Refills: 0      CONTINUE these medications which have NOT CHANGED   Details  acidophilus (RISAQUAD) CAPS capsule Take 1 capsule by mouth 3 (three) times daily.    cholecalciferol (VITAMIN D) 1000 units tablet Take 1,000 Units by mouth daily.    co-enzyme Q-10 30 MG capsule Take 30 mg by mouth 2 (two) times daily.    Cranberry 250 MG TABS Take 250 mg by mouth daily.    docusate sodium (COLACE) 100 MG capsule Take 100 mg by mouth 2 (two) times daily.    gabapentin (NEURONTIN) 250 MG/5ML solution Take 250 mg by mouth 3 (three) times daily.    hydrocortisone cream 1 % Apply 1 application topically every 12 (twelve) hours as needed for itching.  Oxcarbazepine (TRILEPTAL) 300 MG tablet Take 300 mg by mouth 2 (two) times daily.    risperiDONE (RISPERDAL) 1 MG tablet Take 1 mg by mouth 2 (two) times daily.    SELENIUM SULFIDE EX Apply 1 application topically daily. Pt uses on her cheeks on Monday and Thursday.    tamsulosin (FLOMAX) 0.4 MG CAPS capsule Take 0.4 mg by mouth daily after supper.    tuberculin 5 UNIT/0.1ML injection Inject 5 Units into the skin See admin instructions. Pt receives intradermally every 15 days.      STOP taking these medications     omega-3 acid ethyl esters  (LOVAZA) 1 g capsule         If you experience worsening of your admission symptoms, develop shortness of breath, life threatening emergency, suicidal or homicidal thoughts you must seek medical attention immediately by calling 911 or calling your MD immediately  if symptoms less severe.  You Must read complete instructions/literature along with all the possible adverse reactions/side effects for all the Medicines you take and that have been prescribed to you. Take any new Medicines after you have completely understood and accept all the possible adverse reactions/side effects.   Please note  You were cared for by a hospitalist during your hospital stay. If you have any questions about your discharge medications or the care you received while you were in the hospital after you are discharged, you can call the unit and asked to speak with the hospitalist on call if the hospitalist that took care of you is not available. Once you are discharged, your primary care physician will handle any further medical issues. Please note that NO REFILLS for any discharge medications will be authorized once you are discharged, as it is imperative that you return to your primary care physician (or establish a relationship with a primary care physician if you do not have one) for your aftercare needs so that they can reassess your need for medications and monitor your lab values. Today   SUBJECTIVE   Very pleasant this am and talkative  VITAL SIGNS:  Blood pressure 97/51, pulse 85, temperature 98.6 F (37 C), temperature source Oral, resp. rate 16, weight 79.606 kg (175 lb 8 oz), SpO2 93 %.  I/O:    Intake/Output Summary (Last 24 hours) at 01/19/16 0911 Last data filed at 01/18/16 1738  Gross per 24 hour  Intake    680 ml  Output      0 ml  Net    680 ml    PHYSICAL EXAMINATION:  GENERAL:  60 y.o.-year-old patient lying in the bed with no acute distress.  EYES: Pupils equal, round, reactive to light  and accommodation. No scleral icterus. Extraocular muscles intact.  HEENT: Head atraumatic, normocephalic. Oropharynx and nasopharynx clear.  NECK:  Supple, no jugular venous distention. No thyroid enlargement, no tenderness.  LUNGS: Normal breath sounds bilaterally, no wheezing, rales,rhonchi or crepitation. No use of accessory muscles of respiration.  CARDIOVASCULAR: S1, S2 normal. No murmurs, rubs, or gallops.  ABDOMEN: Soft, non-tender, non-distended. Bowel sounds present. No organomegaly or mass.  EXTREMITIES: No pedal edema, cyanosis, or clubbing.  NEUROLOGIC: unable to assess due to dementia but overall non focal PSYCHIATRIC: patient is alert  SKIN: No obvious rash, lesion, or ulcer.   DATA REVIEW:   CBC   Recent Labs Lab 01/15/16 0740  WBC 10.6  HGB 10.7*  HCT 31.8*  PLT 311    Chemistries   Recent Labs Lab 01/14/16 1615  01/16/16  0419  NA 144  < > 139  K 3.4*  < > 3.6  CL 107  < > 110  CO2 29  < > 24  GLUCOSE 123*  < > 93  BUN 23*  < > 13  CREATININE 0.77  < > 0.51  CALCIUM 9.6  < > 8.4*  AST 28  --   --   ALT 34  --   --   ALKPHOS 86  --   --   BILITOT 0.4  --   --   < > = values in this interval not displayed.  Microbiology Results   Recent Results (from the past 240 hour(s))  Urine culture     Status: Abnormal   Collection Time: 01/14/16  5:11 PM  Result Value Ref Range Status   Specimen Description URINE, RANDOM  Final   Special Requests NONE  Final   Culture >=100,000 COLONIES/mL PROTEUS MIRABILIS (A)  Final   Report Status 01/17/2016 FINAL  Final   Organism ID, Bacteria PROTEUS MIRABILIS (A)  Final      Susceptibility   Proteus mirabilis - MIC*    AMPICILLIN <=2 SENSITIVE Sensitive     CEFAZOLIN <=4 SENSITIVE Sensitive     CEFTRIAXONE <=1 SENSITIVE Sensitive     CIPROFLOXACIN <=0.25 SENSITIVE Sensitive     GENTAMICIN <=1 SENSITIVE Sensitive     IMIPENEM 2 SENSITIVE Sensitive     NITROFURANTOIN 128 RESISTANT Resistant     TRIMETH/SULFA  <=20 SENSITIVE Sensitive     AMPICILLIN/SULBACTAM <=2 SENSITIVE Sensitive     PIP/TAZO <=4 SENSITIVE Sensitive     * >=100,000 COLONIES/mL PROTEUS MIRABILIS  Blood culture (routine x 2)     Status: None   Collection Time: 01/14/16  6:45 PM  Result Value Ref Range Status   Specimen Description BLOOD RIGHT ASSIST CONTROL  Final   Special Requests   Final    BOTTLES DRAWN AEROBIC AND ANAEROBIC 6CC AERO 5 CC ANA   Culture NO GROWTH 5 DAYS  Final   Report Status 01/19/2016 FINAL  Final  Blood culture (routine x 2)     Status: None   Collection Time: 01/14/16  6:52 PM  Result Value Ref Range Status   Specimen Description BLOOD RIGHT WRIST  Final   Special Requests BOTTLES DRAWN AEROBIC AND ANAEROBIC 6CC  Final   Culture NO GROWTH 5 DAYS  Final   Report Status 01/19/2016 FINAL  Final  MRSA PCR Screening     Status: None   Collection Time: 01/15/16 12:26 AM  Result Value Ref Range Status   MRSA by PCR NEGATIVE NEGATIVE Final    Comment:        The GeneXpert MRSA Assay (FDA approved for NASAL specimens only), is one component of a comprehensive MRSA colonization surveillance program. It is not intended to diagnose MRSA infection nor to guide or monitor treatment for MRSA infections.     RADIOLOGY:  No results found.   Management plans discussed with the patient, family and they are in agreement.  CODE STATUS:     Code Status Orders        Start     Ordered   01/14/16 2239  Do not attempt resuscitation (DNR)   Continuous    Question Answer Comment  In the event of cardiac or respiratory ARREST Do not call a "code blue"   In the event of cardiac or respiratory ARREST Do not perform Intubation, CPR, defibrillation or ACLS   In the  event of cardiac or respiratory ARREST Use medication by any route, position, wound care, and other measures to relive pain and suffering. May use oxygen, suction and manual treatment of airway obstruction as needed for comfort.      01/14/16  2238    Code Status History    Date Active Date Inactive Code Status Order ID Comments User Context   This patient has a current code status but no historical code status.    Advance Directive Documentation        Most Recent Value   Type of Advance Directive  Out of facility DNR (pink MOST or yellow form)   Pre-existing out of facility DNR order (yellow form or pink MOST form)     "MOST" Form in Place?        TOTAL TIME TAKING CARE OF THIS PATIENT:40 minutes.    Athen Riel M.D on 01/19/2016 at 9:11 AM  Between 7am to 6pm - Pager - [phone] After 6pm go to [email or web address] - password [email or web address] [facility]  Office  [phone]  CC: Primary care physician; Einar Pheasant, MD

## 2016-01-19 NOTE — Telephone Encounter (Signed)
HFU, Dx Is Sepsis. Pt is being discharged today from the hospital. No appt available. Pt needs to be seen in 2 weeks. Let me know where to sch. Thank you!

## 2016-01-19 NOTE — Progress Notes (Addendum)
Vss. Pt alert and talkative to questioning. Drank her ensure upon discharge.report called to [facility] . RN SPOKE TO JENNIFER . FACILITY INFORMED OF HEEL REDNESS  AND CONTINUING KEFLEX FOR 3 MORE. LEFT VIA EMS

## 2016-01-19 NOTE — Telephone Encounter (Signed)
Patient has not been seen by Dr. Nicki Reaper since 04/30/14.  Following up to verify who her current PCP before scheduling.

## 2016-01-19 NOTE — Progress Notes (Signed)
Speech Language Pathology Dysphagia Treatment Patient Details Name: Melinda [name] MRN: [id number] DOB: 1955/09/08 Today's Date: 01/19/2016 Time: [id number] SLP Time Calculation (min) (ACUTE ONLY): 22 min  Assessment / Plan / Recommendation Clinical Impression   pt continues to present with a mild to moderate oral pharyngeal dysphagia characterized by pt noted lengthy mastication, and oral holding of bolus. Pt did not have any overt ssx aspiration with puree solids, or thin liquids via straw. Pt recommended to remain on current diet at this time. ST recommends to limit distractions and only feed when pt is alert. Pt should also have oral care provided post meal to ensure no pocketing or residue of solids.     Diet Recommendation    NDDS1 (puree) with thin liquids.    SLP Plan Continue with current plan of care   Pertinent Vitals/Pain No pain reported   Swallowing Goals     General Behavior/Cognition: Alert;Cooperative;Pleasant mood;Confused Patient Positioning: Upright in bed Oral care provided: N/A HPI: Pt with AMS, UTI and tx frontal lobe dementia.   Oral Cavity - Oral Hygiene     Dysphagia Treatment Treatment Methods: Skilled observation;Upgraded PO texture trial Patient observed directly with PO's: Yes Type of PO's observed: Dysphagia 1 (puree);Thin liquids;Dysphagia 2 (chopped) Feeding: Total assist Liquids provided via: Cup;Straw Oral Phase Signs & Symptoms: Prolonged mastication;Prolonged bolus formation Pharyngeal Phase Signs & Symptoms: Suspected delayed swallow initiation;Multiple swallows Type of cueing: Verbal;Tactile Amount of cueing: Minimal   GO     Melinda [name] 01/19/2016, 11:44 AM

## 2017-03-17 NOTE — Telephone Encounter (Signed)
error 

## 2017-08-26 DEATH — deceased
